# Patient Record
Sex: Male | Born: 1965 | Race: White | Hispanic: No | Marital: Married | Smoking: Never smoker
Health system: Southern US, Community
[De-identification: ages and names within clinical notes are randomized; demographics above are authoritative.]

## PROBLEM LIST (undated history)

## (undated) DIAGNOSIS — E785 Hyperlipidemia, unspecified: Secondary | ICD-10-CM

## (undated) DIAGNOSIS — I10 Essential (primary) hypertension: Secondary | ICD-10-CM

## (undated) HISTORY — DX: Essential (primary) hypertension: I10

## (undated) HISTORY — DX: Hyperlipidemia, unspecified: E78.5

## (undated) HISTORY — PX: HERNIA REPAIR: SHX51

---

## 2005-09-18 HISTORY — PX: SPINAL FUSION: SHX223

## 2006-07-30 ENCOUNTER — Encounter: Admission: RE | Admit: 2006-07-30 | Discharge: 2006-07-30 | Payer: Self-pay | Admitting: Specialist

## 2006-09-06 ENCOUNTER — Inpatient Hospital Stay (HOSPITAL_COMMUNITY): Admission: RE | Admit: 2006-09-06 | Discharge: 2006-09-10 | Payer: Self-pay | Admitting: Specialist

## 2008-02-02 IMAGING — CR DG CHEST 2V
2 series · 2 of 2 positions shown · non-contrast
Comparison: none

CLINICAL DATA: Spondylolisthesis L4-5. Preoperative chest.
CHEST - 2 VIEW:

[view not recorded (1 of 2)]
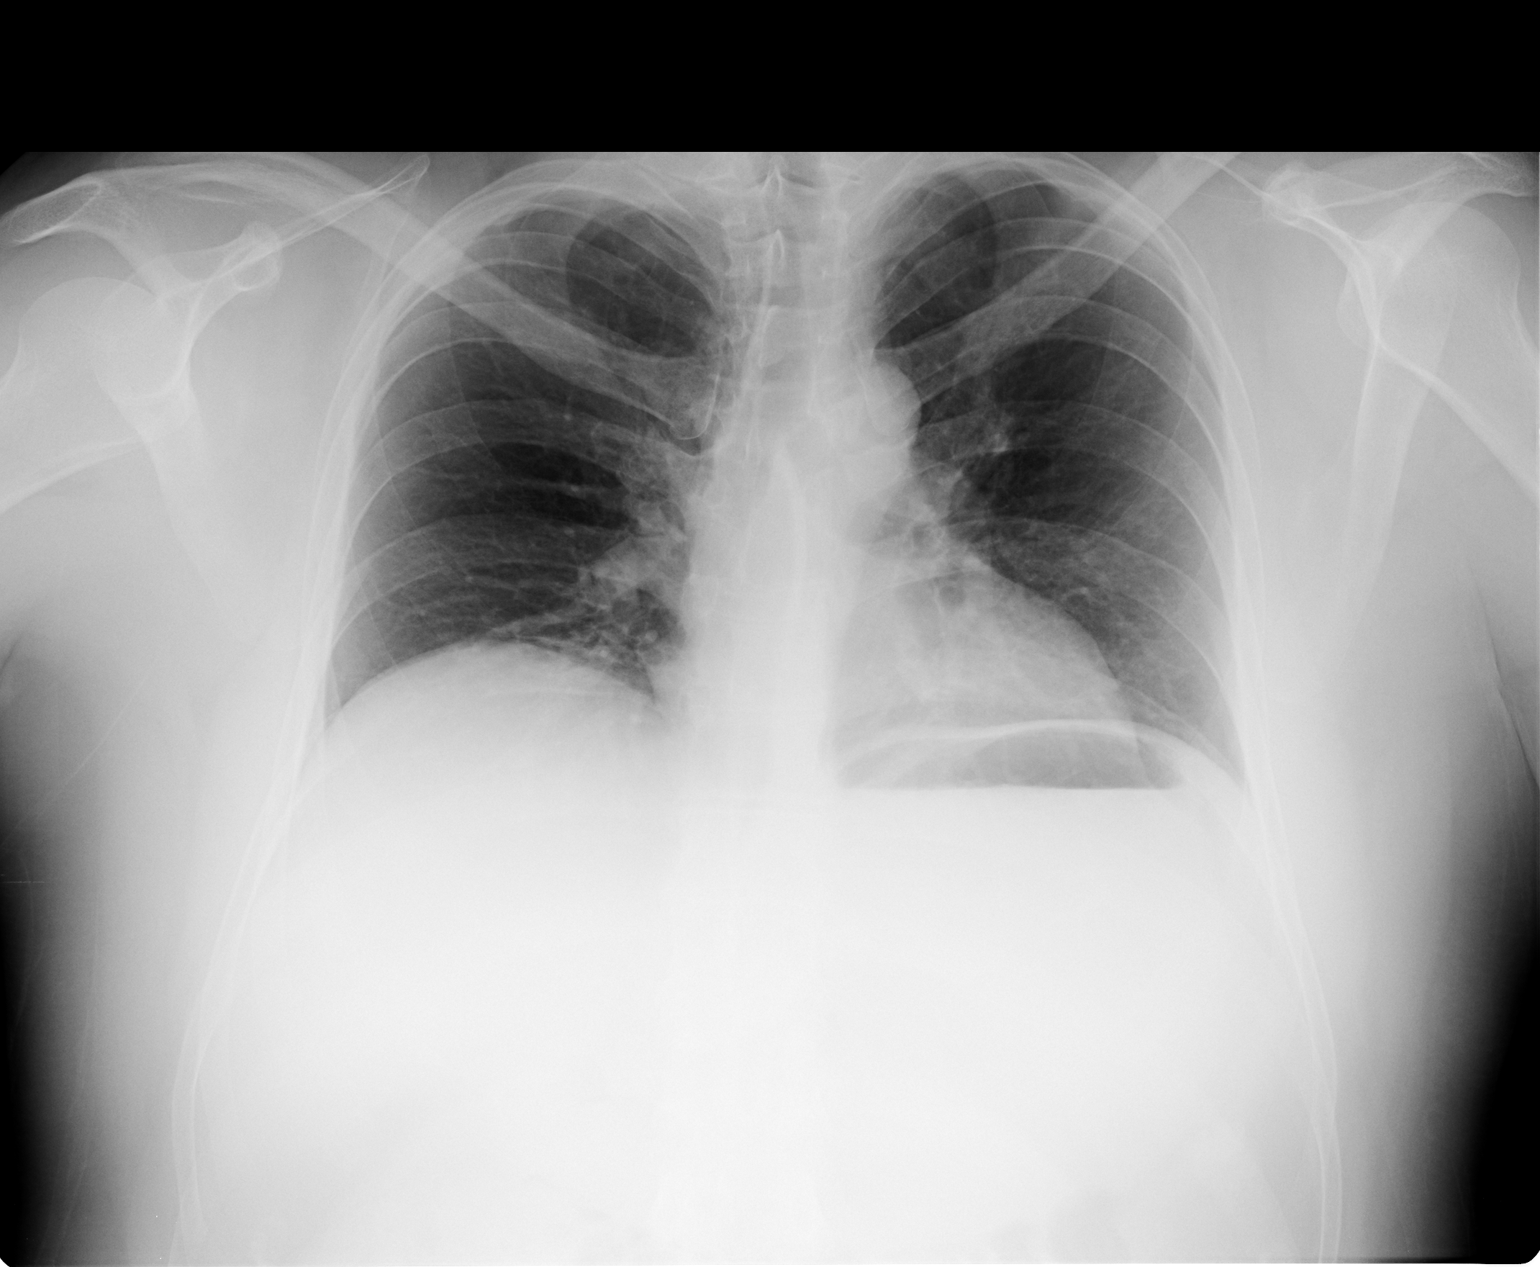

[view not recorded (2 of 2)]
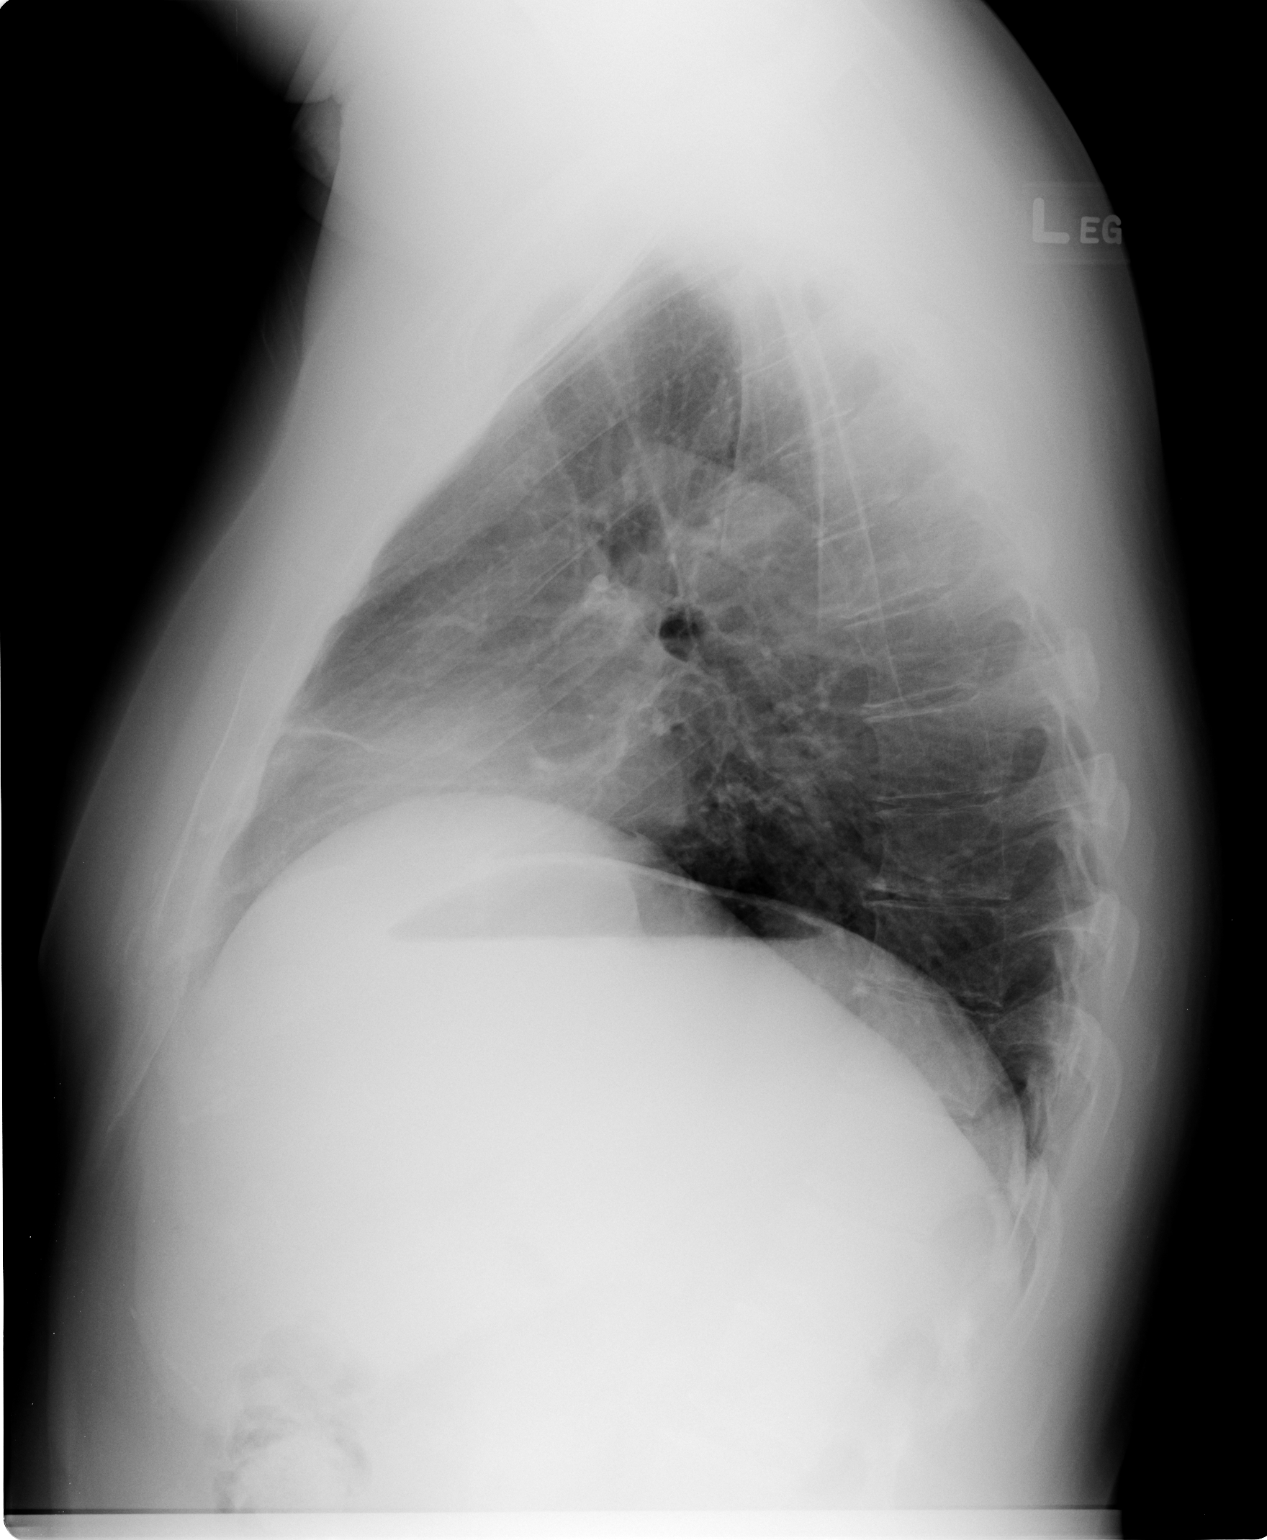

[2 of 2 positions shown; findings below may reference images not displayed]

FINDINGS: There are no prior studies available for comparison at this time. There is mild elevation of the right hemidiaphragm and there is mild accentuation/crowding of the bronchovascular markings within the right lung base.  There is an area of linear scarring/atelectasis seen within the right middle lobe anteriorly located.  The heart is normal in size and there are no mediastinal or hilar abnormalities.
IMPRESSION: Mildly elevated right hemidiaphragm with mild accentuation of the right basilar bronchovascular markings and an area of linear atelectasis/scarring within the right middle lobe.

## 2010-10-27 ENCOUNTER — Ambulatory Visit: Payer: Self-pay

## 2010-10-27 ENCOUNTER — Other Ambulatory Visit: Payer: Self-pay | Admitting: Occupational Medicine

## 2010-10-27 DIAGNOSIS — R52 Pain, unspecified: Secondary | ICD-10-CM

## 2015-07-06 ENCOUNTER — Encounter: Payer: Self-pay | Admitting: Gastroenterology

## 2016-07-12 ENCOUNTER — Ambulatory Visit: Payer: Self-pay | Admitting: Podiatry

## 2016-07-26 ENCOUNTER — Ambulatory Visit: Payer: Self-pay | Admitting: Sports Medicine

## 2016-08-03 ENCOUNTER — Ambulatory Visit: Payer: Self-pay | Admitting: Sports Medicine

## 2016-09-20 ENCOUNTER — Ambulatory Visit: Payer: Self-pay | Admitting: Sports Medicine

## 2020-09-02 NOTE — Progress Notes (Signed)
Cardiology Office Note:    Date:  09/03/2020   ID:  Erik Sutton, DOB 1966/03/20, MRN [id number]  PCP:  Ignacia Palma., MD  Cardiologist:  Norman Herrlich, MD   Referring MD: Dr. Susa Loffler [place] family physician  ASSESSMENT:    1. Paroxysmal atrial fibrillation (HCC)   2. Essential hypertension    PLAN:    In order of problems listed above:  1. Discussion below is a great deal of artifact on his EKG he said he was shivering at the time it is probable atrial fibrillation but not certain what we are going to do here is apply a ZIO monitor for 7 days as a tie breaker monitor closely at home if he had a single isolated brief episode of atrial fibrillation I would not initiate antiarrhythmic drug or an anticoagulant at this time.  To reduce the risk for GI toxicity reduce his aspirin 81 mg daily. 2. BP at target continue current treatment  Next appointment 6 weeks   Medication Adjustments/Labs and Tests Ordered: Current medicines are reviewed at length with the patient today.  Concerns regarding medicines are outlined above.  No orders of the defined types were placed in this encounter.  No orders of the defined types were placed in this encounter.    Chief Complaint  Patient presents with  . Atrial Fibrillation     History of Present Illness:    His wife is present for evaluation and treatment and decision making and participated  Erik Sutton is a 54 y.o. male who is being seen today for the evaluation of atrial fibrillation at the request of his family physician Dr. Susa Loffler who phoned me the day he was seen in the office that the patient was in atrial fibrillation. Today he is in sinus rhythm and has had no symptoms of palpitation chest discomfort shortness of breath or syncope. 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 He has no known  history of heart disease congenital rheumatic or heart murmur.  Attained his records his EKG is difficult he has a great deal of baseline artifact it is in the category of probable atrial fibrillation but there are parts of it that appear regular and the alternative is that he had an episode of atrial tachycardia.  We are going to supply a 7-day ZIO monitor as a tie breaker.  His wife will screen heart rate and blood pressure and record at home looking for signal and I will see him back in the office in 6 weeks  Recent labs 08/25/2020 CMP is normal creatinine 1.03 GFR 81 potassium 4.4 Cholesterol 133 LDL 77 triglycerides 121 HDL 34 he has a very favorable lipid profile TSH normal 2.97 CBC normal hemoglobin 14.7 Past Medical History:  Diagnosis Date  . Hyperlipidemia     Past Surgical History:  Procedure Laterality Date  . HERNIA REPAIR    . SPINAL FUSION  2007   L4, L5    Current Medications: Current Meds  Medication Sig  . ALPRAZolam (XANAX) 1 MG tablet Take by mouth. 0.5 TABLET THREE TIMES A DAY  . amLODipine (NORVASC) 5 MG tablet Take 5 mg by mouth daily.  . APPLE CIDER VINEGAR PO Take 450 mg by mouth daily.  . Ascorbic Acid (VITAMIN C) 1000 MG tablet Take 1,000 mg by mouth daily.  Marland Kitchen aspirin 325 MG tablet Take 325  mg by mouth daily.  . cetirizine (ZYRTEC) 10 MG tablet Take 10 mg by mouth daily.  Marland Kitchen DANDELION PO Take 1,575 mg by mouth daily.  Marland Kitchen ELDERBERRY PO Take 50 mg by mouth daily.  . Flaxseed, Linseed, (FLAXSEED OIL) 1000 MG CAPS Take 1,000 mg by mouth daily.  Marland Kitchen gabapentin (NEURONTIN) 300 MG capsule Take 300 mg by mouth 3 (three) times daily.  Marland Kitchen HYDROcodone-acetaminophen (NORCO/VICODIN) 5-325 MG tablet Take 1 tablet by mouth 2 (two) times daily as needed.  Marland Kitchen lisinopril (ZESTRIL) 40 MG tablet Take 40 mg by mouth daily.  . Multiple Vitamins-Minerals (MULTIVITAMIN MEN PO) Take 1 tablet by mouth.  . Omega-3 Fatty Acids (FISH OIL) 1000 MG CPDR Take 1,000 mg by mouth daily.  Marland Kitchen  omeprazole (PRILOSEC) 20 MG capsule Take 20 mg by mouth daily.  . rosuvastatin (CRESTOR) 10 MG tablet Take 10 mg by mouth at bedtime.  Marland Kitchen UNABLE TO FIND Take 1 tablet by mouth daily. NATURE'S BLEND STRESS  . Vitamin D, Ergocalciferol, (DRISDOL) 1.25 MG (50000 UNIT) CAPS capsule Take 50,000 Units by mouth once a week.  . zolpidem (AMBIEN) 10 MG tablet Take 10 mg by mouth at bedtime as needed.     Allergies:   Latex   Social History   Socioeconomic History  . Marital status: Married    Spouse name: Not on file  . Number of children: Not on file  . Years of education: Not on file  . Highest education level: Not on file  Occupational History  . Not on file  Tobacco Use  . Smoking status: Never Smoker  . Smokeless tobacco: Never Used  Vaping Use  . Vaping Use: Never used  Substance and Sexual Activity  . Alcohol use: Never  . Drug use: Never  . Sexual activity: Not on file  Other Topics Concern  . Not on file  Social History Narrative  . Not on file   Social Determinants of Health   Financial Resource Strain: Not on file  Food Insecurity: Not on file  Transportation Needs: Not on file  Physical Activity: Not on file  Stress: Not on file  Social Connections: Not on file     Family History: The patient's family history includes Atrial fibrillation in his mother; Diabetes in his father; Heart disease in his brother and father; Hypertension in his mother; Parkinson's disease in his paternal grandmother; Stroke in his mother.  ROS:   ROS Please see the history of present illness.   He also has chronic pain and posttraumatic stress.  All other systems reviewed and are negative.  EKGs/Labs/Other Studies Reviewed:    The following studies were reviewed today:   EKG:  EKG is  ordered today.  The ekg ordered today is personally reviewed and demonstrates sinus rhythm and is normal  Recent Labs: No results found for requested labs within last 8760 hours.  Recent Lipid  Panel No results found for: CHOL, TRIG, HDL, CHOLHDL, VLDL, LDLCALC, LDLDIRECT  Physical Exam:    VS:  BP 106/78   Pulse 85   Ht 6\' 2"  (1.88 m)   Wt 249 lb 1.9 oz (113 kg)   SpO2 97%   BMI 31.99 kg/m     Wt Readings from Last 3 Encounters:  09/03/20 249 lb 1.9 oz (113 kg)     GEN:  Well nourished, well developed in no acute distress HEENT: Normal NECK: No JVD; No carotid bruits LYMPHATICS: No lymphadenopathy CARDIAC: RRR, no murmurs, rubs, gallops RESPIRATORY:  Clear to auscultation without rales, wheezing or rhonchi  ABDOMEN: Soft, non-tender, non-distended MUSCULOSKELETAL:  No edema; No deformity  SKIN: Warm and dry NEUROLOGIC:  Alert and oriented x 3 PSYCHIATRIC:  Normal affect     Signed, Norman Herrlich, MD  09/03/2020 10:41 AM    [facility]

## 2020-09-03 ENCOUNTER — Other Ambulatory Visit: Payer: Self-pay

## 2020-09-03 ENCOUNTER — Ambulatory Visit (INDEPENDENT_AMBULATORY_CARE_PROVIDER_SITE_OTHER): Payer: Federal, State, Local not specified - PPO

## 2020-09-03 ENCOUNTER — Ambulatory Visit: Payer: Federal, State, Local not specified - PPO | Admitting: Cardiology

## 2020-09-03 ENCOUNTER — Encounter: Payer: Self-pay | Admitting: Cardiology

## 2020-09-03 VITALS — BP 106/78 | HR 85 | Ht 74.0 in | Wt 249.1 lb

## 2020-09-03 DIAGNOSIS — I480 Paroxysmal atrial fibrillation: Secondary | ICD-10-CM | POA: Diagnosis not present

## 2020-09-03 DIAGNOSIS — I10 Essential (primary) hypertension: Secondary | ICD-10-CM

## 2020-09-03 HISTORY — DX: Essential (primary) hypertension: I10

## 2020-09-03 NOTE — Patient Instructions (Signed)
Medication Instructions:  Your physician recommends that you continue on your current medications as directed. Please refer to the Current Medication list given to you today.  *If you need a refill on your cardiac medications before your next appointment, please call your pharmacy*   Lab Work: None If you have labs (blood work) drawn today and your tests are completely normal, you will receive your results only by: . MyChart Message (if you have MyChart) OR . A paper copy in the mail If you have any lab test that is abnormal or we need to change your treatment, we will call you to review the results.   Testing/Procedures: A zio monitor was ordered today. It will remain on for 7 days. You will then return monitor and event diary in provided box. It takes 1-2 weeks for report to be downloaded and returned to us. We will call you with the results. If monitor falls off or has orange flashing light, please call Z[employer] for further instructions.      Follow-Up: At [facility], you and your health needs are our priority.  As part of our continuing mission to provide you with exceptional heart care, we have created designated Provider Care Teams.  These Care Teams include your primary Cardiologist (physician) and Advanced Practice Providers (APPs -  Physician Assistants and Nurse Practitioners) who all work together to provide you with the care you need, when you need it.  We recommend signing up for the patient portal called "MyChart".  Sign up information is provided on this After Visit Summary.  MyChart is used to connect with patients for Virtual Visits (Telemedicine).  Patients are able to view lab/test results, encounter notes, upcoming appointments, etc.  Non-urgent messages can be sent to your provider as well.   To learn more about what you can do with MyChart, go to [email or web address].    Your next appointment:   6 week(s)  The format for your next appointment:   In  Person  Provider:   Brian Munley, MD   Other Instructions  

## 2020-09-09 ENCOUNTER — Telehealth: Payer: Self-pay | Admitting: Cardiology

## 2020-09-09 NOTE — Telephone Encounter (Signed)
    Pt's wife calling, she said pt is asking how long he needs to wear heart monitor

## 2020-09-09 NOTE — Telephone Encounter (Signed)
I called and spoke with Vickie and explained the monitor should be on for 7 days as specified on notes. Patient also comment she call Zio and they mention registration was not completed and they stated the onset date yesterday. I called [employer] and had them correct the date to 12/17 when the monitor was put on. Patient wife has been notified.

## 2020-09-26 ENCOUNTER — Encounter: Payer: Self-pay | Admitting: Cardiology

## 2020-09-27 ENCOUNTER — Telehealth: Payer: Self-pay

## 2020-09-27 NOTE — Telephone Encounter (Signed)
-----   Message from Baldo Daub, MD sent at 09/26/2020 11:06 AM EST ----- Please call this report to the patient, the monitor indeed shows he is having episodes of atrial fibrillation about 20% of the time he wore the monitor  I would like him to start a low-dose beta-blocker that she reduce the frequency and duration Toprol-XL 25 mg daily  Continue aspirin and I can review with him at office follow-up scheduled February  A ZIO monitor was used for 7 days initiated 09/03/2020 to assess for atrial fibrillation. The predominant rhythm was sinus with average minimum maximum heart rates of 74, 51 and 133 bpm. Atrial fibrillation was present 20% of the time average heart rate 89 bpm the longest episode 4 hours and 40 minutes.  Ventricular ectopy was rare.  Supraventricular ectopy was rare.  There were no pauses of 3 seconds or greater and no episodes of AV nodal or sinus node exit block.  There was brief atrial flutter prior to termination of an episode of atrial fibrillation.  The longest pause on conversion to sinus rhythm was 1.7 seconds.   Conclusion atrial fibrillation/flutter with a burden of 20%.

## 2020-09-27 NOTE — Telephone Encounter (Signed)
Left message on patients voicemail to please return our call.   

## 2020-09-28 ENCOUNTER — Telehealth: Payer: Self-pay

## 2020-09-28 MED ORDER — METOPROLOL SUCCINATE ER 25 MG PO TB24: 25.0000 mg | ORAL_TABLET | Freq: Every day | ORAL | 3 refills | Status: DC

## 2020-09-28 NOTE — Telephone Encounter (Signed)
Spoke with patient regarding results and recommendation.  Patient verbalizes understanding and is agreeable to plan of care. Advised patient to call back with any issues or concerns.  

## 2020-09-28 NOTE — Telephone Encounter (Signed)
-----   Message from Brian J Munley, MD sent at 09/26/2020 11:06 AM EST ----- Please call this report to the patient, the monitor indeed shows he is having episodes of atrial fibrillation about 20% of the time he wore the monitor  I would like him to start a low-dose beta-blocker that she reduce the frequency and duration Toprol-XL 25 mg daily  Continue aspirin and I can review with him at office follow-up scheduled February  A ZIO monitor was used for 7 days initiated 09/03/2020 to assess for atrial fibrillation. The predominant rhythm was sinus with average minimum maximum heart rates of 74, 51 and 133 bpm. Atrial fibrillation was present 20% of the time average heart rate 89 bpm the longest episode 4 hours and 40 minutes.  Ventricular ectopy was rare.  Supraventricular ectopy was rare.  There were no pauses of 3 seconds or greater and no episodes of AV nodal or sinus node exit block.  There was brief atrial flutter prior to termination of an episode of atrial fibrillation.  The longest pause on conversion to sinus rhythm was 1.7 seconds.   Conclusion atrial fibrillation/flutter with a burden of 20%. 

## 2020-10-15 DIAGNOSIS — E785 Hyperlipidemia, unspecified: Secondary | ICD-10-CM | POA: Insufficient documentation

## 2020-10-15 DIAGNOSIS — I10 Essential (primary) hypertension: Secondary | ICD-10-CM | POA: Insufficient documentation

## 2020-10-27 NOTE — Progress Notes (Signed)
Cardiology Office Note:    Date:  10/28/2020   ID:  Erik Sutton, DOB [birth year], MRN [id number]  PCP:  Erik Sutton., MD  Cardiologist:  Erik Herrlich, MD    Referring MD: Erik Sutton., MD    ASSESSMENT:    1. Paroxysmal atrial fibrillation (HCC)   2. Essential hypertension   3. Mixed hyperlipidemia    PLAN:    In order of problems listed above:  1. Symptomatically doing well tolerates a beta-blocker no obvious clinical recurrence for better surveillance asked him to purchase the iPhone adapter sign up for my chart and send me episodes if he documents A. fib.  If episodes are frequent he may benefit him antiarrhythmic drug like flecainide and also rethink the issue of anticoagulation.  I did ask him to have an echocardiogram to assess structural changes in his heart. 2. Well-controlled continue lisinopril await labs for renal function potassium 3. Continue statin await his lipid profile   Next appointment: 6 months   Medication Adjustments/Labs and Tests Ordered: Current medicines are reviewed at length with the patient today.  Concerns regarding medicines are outlined above.  No orders of the defined types were placed in this encounter.  No orders of the defined types were placed in this encounter.   Chief Complaint  Patient presents with   Follow-up   Atrial Fibrillation    History of Present Illness:    Erik Sutton is a 55 y.o. male with a hx of paroxysmal atrial fibrillation last seen 02/02/2020.  Following that visit he used an event monitor which documented atrial fibrillation with a burden of 20% was placed on a low-dose beta-blocker. Compliance with diet, lifestyle and medications: Yes  He has no awareness of recurrent atrial fibrillation tracks heart rate at home 6070 bpm no irregularity and blood pressure runs 120/805.  He is on low-dose aspirin no GI upset or bleeding and tolerates the beta-blocker without side effects or weakness. We discussed  anticoagulation with a chads 2 score of 1 and he is decided to remain on aspirin. We discussed antiarrhythmic drug therapy at this time he is asymptomatic and what I have asked him to do is purchased the alive cor smart phone peripheral and screen his heart rhythm at home and if he is having frequent episodes and send me a my chart and we will start antiarrhythmic drug or consider EP catheter ablation. He had recent labs at his PCP office I requested him he tells me they are all in range Blood pressure is well controlled And will schedule an echocardiogram for atrial fibrillation.  Past Medical History:  Diagnosis Date   Essential hypertension 09/03/2020   Hyperlipidemia    Hypertension     Past Surgical History:  Procedure Laterality Date   HERNIA REPAIR     SPINAL FUSION  2007   L4, L5    Current Medications: Current Meds  Medication Sig   ALPRAZolam (XANAX) 1 MG tablet Take by mouth. 0.5 TABLET THREE TIMES A DAY   amLODipine (NORVASC) 5 MG tablet Take 5 mg by mouth daily.   APPLE CIDER VINEGAR PO Take 450 mg by mouth daily.   Ascorbic Acid (VITAMIN C) 1000 MG tablet Take 1,000 mg by mouth daily.   aspirin EC 81 MG tablet Take 81 mg by mouth daily. Swallow whole.   cetirizine (ZYRTEC) 10 MG tablet Take 10 mg by mouth daily.   cholecalciferol (VITAMIN D3) 25 MCG (1000 UNIT) tablet Take 1,000 Units by mouth daily.  DANDELION PO Take 1,575 mg by mouth daily.   ELDERBERRY PO Take 50 mg by mouth daily.   Flaxseed, Linseed, (FLAXSEED OIL) 1000 MG CAPS Take 1,000 mg by mouth daily.   gabapentin (NEURONTIN) 300 MG capsule Take 300 mg by mouth 3 (three) times daily.   HYDROcodone-acetaminophen (NORCO/VICODIN) 5-325 MG tablet Take 1 tablet by mouth 2 (two) times daily as needed.   lisinopril (ZESTRIL) 40 MG tablet Take 40 mg by mouth daily.   metoprolol succinate (TOPROL XL) 25 MG 24 hr tablet Take 1 tablet (25 mg total) by mouth daily.   Multiple Vitamins-Minerals  (MULTIVITAMIN MEN PO) Take 1 tablet by mouth.   Omega-3 Fatty Acids (FISH OIL) 1000 MG CPDR Take 1,000 mg by mouth daily.   omeprazole (PRILOSEC) 20 MG capsule Take 20 mg by mouth daily.   rosuvastatin (CRESTOR) 10 MG tablet Take 10 mg by mouth at bedtime.   UNABLE TO FIND Take 1 tablet by mouth daily. NATURE'S BLEND STRESS   zolpidem (AMBIEN) 10 MG tablet Take 10 mg by mouth at bedtime as needed for sleep.     Allergies:   Latex   Social History   Socioeconomic History   Marital status: Married    Spouse name: Not on file   Number of children: Not on file   Years of education: Not on file   Highest education level: Not on file  Occupational History   Not on file  Tobacco Use   Smoking status: Never Smoker   Smokeless tobacco: Never Used  Vaping Use   Vaping Use: Never used  Substance and Sexual Activity   Alcohol use: Never   Drug use: Never   Sexual activity: Not on file  Other Topics Concern   Not on file  Social History Narrative   Not on file   Social Determinants of Health   Financial Resource Strain: Not on file  Food Insecurity: Not on file  Transportation Needs: Not on file  Physical Activity: Not on file  Stress: Not on file  Social Connections: Not on file     Family History: The patient's family history includes Atrial fibrillation in his mother; Diabetes in his father; Heart disease in his brother and father; Hypertension in his mother; Parkinson's disease in his paternal grandmother; Stroke in his mother. ROS:   Please see the history of present illness.    All other systems reviewed and are negative.  EKGs/Labs/Other Studies Reviewed:    The following studies were reviewed today:  EKG:  EKG ordered today and personally reviewed.  The ekg ordered today demonstrates sinus rhythm normal  Recent Labs: Requesting and awaiting labs from his PCP open addendum to this note  Physical Exam:    VS:  BP 118/84 (BP Location: Right Arm,  Patient Position: Sitting, Cuff Size: Normal)    Pulse 64    Ht 6\' 2"  (1.88 m)    Wt 252 lb (114.3 kg)    SpO2 96%    BMI 32.35 kg/m     Wt Readings from Last 3 Encounters:  10/28/20 252 lb (114.3 kg)  09/03/20 249 lb 1.9 oz (113 kg)     GEN:  Well nourished, well developed in no acute distress HEENT: Normal NECK: No JVD; No carotid bruits LYMPHATICS: No lymphadenopathy CARDIAC: RRR, no murmurs, rubs, gallops RESPIRATORY:  Clear to auscultation without rales, wheezing or rhonchi  ABDOMEN: Soft, non-tender, non-distended MUSCULOSKELETAL:  No edema; No deformity  SKIN: Warm and dry NEUROLOGIC:  Alert  and oriented x 3 PSYCHIATRIC:  Normal affect    Signed, Erik Herrlich, MD  10/28/2020 2:44 PM    [facility]

## 2020-10-28 ENCOUNTER — Other Ambulatory Visit: Payer: Self-pay

## 2020-10-28 ENCOUNTER — Encounter: Payer: Self-pay | Admitting: Cardiology

## 2020-10-28 ENCOUNTER — Ambulatory Visit: Payer: Federal, State, Local not specified - PPO | Admitting: Cardiology

## 2020-10-28 VITALS — BP 118/84 | HR 64 | Ht 74.0 in | Wt 252.0 lb

## 2020-10-28 DIAGNOSIS — I480 Paroxysmal atrial fibrillation: Secondary | ICD-10-CM

## 2020-10-28 DIAGNOSIS — E782 Mixed hyperlipidemia: Secondary | ICD-10-CM

## 2020-10-28 DIAGNOSIS — I10 Essential (primary) hypertension: Secondary | ICD-10-CM | POA: Diagnosis not present

## 2020-10-28 NOTE — Patient Instructions (Addendum)
Medication Instructions:  Your physician recommends that you continue on your current medications as directed. Please refer to the Current Medication list given to you today.  *If you need a refill on your cardiac medications before your next appointment, please call your pharmacy*   Lab Work: None If you have labs (blood work) drawn today and your tests are completely normal, you will receive your results only by: . MyChart Message (if you have MyChart) OR . A paper copy in the mail If you have any lab test that is abnormal or we need to change your treatment, we will call you to review the results.   Testing/Procedures: Your physician has requested that you have an echocardiogram. Echocardiography is a painless test that uses sound waves to create images of your heart. It provides your doctor with information about the size and shape of your heart and how well your heart's chambers and valves are working. This procedure takes approximately one hour. There are no restrictions for this procedure.     Follow-Up: At [facility], you and your health needs are our priority.  As part of our continuing mission to provide you with exceptional heart care, we have created designated Provider Care Teams.  These Care Teams include your primary Cardiologist (physician) and Advanced Practice Providers (APPs -  Physician Assistants and Nurse Practitioners) who all work together to provide you with the care you need, when you need it.  We recommend signing up for the patient portal called "MyChart".  Sign up information is provided on this After Visit Summary.  MyChart is used to connect with patients for Virtual Visits (Telemedicine).  Patients are able to view lab/test results, encounter notes, upcoming appointments, etc.  Non-urgent messages can be sent to your provider as well.   To learn more about what you can do with MyChart, go to [email or web address].    Your next appointment:   6  month(s)  The format for your next appointment:   In Person  Provider:   Marie Borowski, MD   Other Instructions   KardiaMobile [email or web address]        FDA-cleared, clinical grade mobile EKG monitor: Kardia is the most clinically-validated mobile EKG used by the world's leading cardiac care medical professionals With Basic service, know instantly if your heart rhythm is normal or if atrial fibrillation is detected, and email the last single EKG recording to yourself or your doctor Premium service, available for purchase through the Kardia app for $9.99 per month or $99 per year, includes unlimited history and storage of your EKG recordings, a monthly EKG summary report to share with your doctor, along with the ability to track your blood pressure, activity and weight Includes one KardiaMobile phone clip FREE SHIPPING: Standard delivery 1-3 business days. Orders placed by 11:00am PST will ship that afternoon. Otherwise, will ship next business day. All orders ship via USPS Priority Mail from [place], [place]     

## 2020-11-29 ENCOUNTER — Other Ambulatory Visit: Payer: Self-pay

## 2020-11-29 ENCOUNTER — Ambulatory Visit (INDEPENDENT_AMBULATORY_CARE_PROVIDER_SITE_OTHER): Payer: Federal, State, Local not specified - PPO

## 2020-11-29 ENCOUNTER — Telehealth: Payer: Self-pay

## 2020-11-29 DIAGNOSIS — I480 Paroxysmal atrial fibrillation: Secondary | ICD-10-CM

## 2020-11-29 LAB — ECHOCARDIOGRAM COMPLETE
Area-P 1/2: 4.52 cm2
Calc EF: 57 %
S' Lateral: 2.85 cm
Single Plane A2C EF: 60.8 %
Single Plane A4C EF: 52.7 %

## 2020-11-29 NOTE — Telephone Encounter (Signed)
Patient notified of results.

## 2020-11-29 NOTE — Telephone Encounter (Signed)
-----   Message from Baldo Daub, MD sent at 11/29/2020 12:32 PM EDT ----- Good result echocardiogram is normal

## 2020-11-29 NOTE — Progress Notes (Signed)
Complete echocardiogram performed.  Jimmy Seham [place] RDCS, RVT  

## 2020-12-07 NOTE — Telephone Encounter (Signed)
Patient's wife is returning phone call. Please advise. [phone].

## 2020-12-08 ENCOUNTER — Ambulatory Visit: Payer: Federal, State, Local not specified - PPO | Admitting: Cardiology

## 2020-12-08 NOTE — Progress Notes (Deleted)
Cardiology Office Note:    Date:  12/08/2020   ID:  Erik Sutton, DOB [birth year], MRN [id number]  PCP:  Ignacia Palma., MD  Cardiologist:  Norman Herrlich, MD    Referring MD: Ignacia Palma., MD    ASSESSMENT:    No diagnosis found. PLAN:    In order of problems listed above:  1. ***   Next appointment: ***   Medication Adjustments/Labs and Tests Ordered: Current medicines are reviewed at length with the patient today.  Concerns regarding medicines are outlined above.  No orders of the defined types were placed in this encounter.  No orders of the defined types were placed in this encounter.   No chief complaint on file.   History of Present Illness:    Erik Sutton is a 55 y.o. male with a hx of paroxysmal atrial fibrillation with an A. fib burden of 20% on extended ambulatory monitoring hypertension hyper lipidemia last seen 10/28/2020.  He phoned the office yesterday to tell us he is having increasing frequency and duration of symptomatic atrial fibrillation and was advised office follow-up as he is going to require antiarrhythmic drug therapy or referral for EP catheter ablation. Compliance with diet, lifestyle and medications: *** Past Medical History:  Diagnosis Date  . Essential hypertension 09/03/2020  . Hyperlipidemia   . Hypertension     Past Surgical History:  Procedure Laterality Date  . HERNIA REPAIR    . SPINAL FUSION  2007   L4, L5    Current Medications: No outpatient medications have been marked as taking for the 12/08/20 encounter (Appointment) with Baldo Daub, MD.     Allergies:   Latex   Social History   Socioeconomic History  . Marital status: Married    Spouse name: Not on file  . Number of children: Not on file  . Years of education: Not on file  . Highest education level: Not on file  Occupational History  . Not on file  Tobacco Use  . Smoking status: Never Smoker  . Smokeless tobacco: Never Used  Vaping Use  . Vaping  Use: Never used  Substance and Sexual Activity  . Alcohol use: Never  . Drug use: Never  . Sexual activity: Not on file  Other Topics Concern  . Not on file  Social History Narrative  . Not on file   Social Determinants of Health   Financial Resource Strain: Not on file  Food Insecurity: Not on file  Transportation Needs: Not on file  Physical Activity: Not on file  Stress: Not on file  Social Connections: Not on file     Family History: The patient's ***family history includes Atrial fibrillation in his mother; Diabetes in his father; Heart disease in his brother and father; Hypertension in his mother; Parkinson's disease in his paternal grandmother; Stroke in his mother. ROS:   Please see the history of present illness.    All other systems reviewed and are negative.  EKGs/Labs/Other Studies Reviewed:    The following studies were reviewed today:  EKG:  EKG ordered today and personally reviewed.  The ekg ordered today demonstrates ***  Recent Labs: No results found for requested labs within last 8760 hours.  Recent Lipid Panel No results found for: CHOL, TRIG, HDL, CHOLHDL, VLDL, LDLCALC, LDLDIRECT  Physical Exam:    VS:  There were no vitals taken for this visit.    Wt Readings from Last 3 Encounters:  10/28/20 252 lb (114.3 kg)  09/03/20 249  lb 1.9 oz (113 kg)     GEN: *** Well nourished, well developed in no acute distress HEENT: Normal NECK: No JVD; No carotid bruits LYMPHATICS: No lymphadenopathy CARDIAC: ***RRR, no murmurs, rubs, gallops RESPIRATORY:  Clear to auscultation without rales, wheezing or rhonchi  ABDOMEN: Soft, non-tender, non-distended MUSCULOSKELETAL:  No edema; No deformity  SKIN: Warm and dry NEUROLOGIC:  Alert and oriented x 3 PSYCHIATRIC:  Normal affect    Signed, Norman Herrlich, MD  12/08/2020 8:22 AM    [facility]

## 2020-12-16 ENCOUNTER — Telehealth: Payer: Self-pay | Admitting: Cardiology

## 2020-12-16 NOTE — Telephone Encounter (Signed)
FYI--Patient's wife called to cancel his appointment on 12/17/20. He will still follow up with Dr. Dulce Sellar on 04/27/21. She wanted to make Dr. Dulce Sellar aware that the patient's issue with afib has been resolved. She states he was taking his beta-blocker with other supplements. They assume the supplements were causing the Metoprolol to be ineffective.  She states the patient stated taking his his Metoprolol at a different time of day and he has not gone into afib at all ever since.

## 2020-12-17 ENCOUNTER — Ambulatory Visit: Payer: Federal, State, Local not specified - PPO | Admitting: Cardiology

## 2020-12-21 ENCOUNTER — Telehealth: Payer: Self-pay | Admitting: Cardiology

## 2020-12-21 NOTE — Telephone Encounter (Signed)
Scheduler [place] about this.

## 2020-12-21 NOTE — Telephone Encounter (Signed)
Called and spoke to patient wife per dpr. She reports the patient has been under a lot of stress at work lately. On Saturday he ate something with chocolate in it (which he has not been doing, he is also limiting caffeine) and he went back into atrial fibrillation that night and was feeling palpitations in his chest. He did not have any chest pain or shortness of breath during this episode. He is not having any issues now either he feels better now. But his wife is requesting he be checked out. He wants to be seen next week. He can be seen any day besides April 14th, 2021. I advised her we will check with Dr. Dulce Sellar to see if he wants to add him on next week.

## 2020-12-21 NOTE — Telephone Encounter (Signed)
Unfortunately have no ability to fit in more patients in my schedule before you leave for my son's wedding unless there is an opening at the med [facility] schedule please contact Amalia Hailey otherwise will need to see another physician.

## 2020-12-21 NOTE — Telephone Encounter (Signed)
Patient c/o Palpitations:  High priority if patient c/o lightheadedness, shortness of breath, or chest pain  1) How long have you had palpitations/irregular HR/ Afib? Are you having the symptoms now? 4-5 days, not having symptoms now   2) Are you currently experiencing lightheadedness, SOB or CP? No   3) Do you have a history of afib (atrial fibrillation) or irregular heart rhythm? Yes, was diagnosised about 6 months ago   4) Have you checked your BP or HR? (document readings if available):   96/50's the other night (has BP readings recorded on app, not with husband now  to get readings)  5) Are you experiencing any other symptoms? No

## 2020-12-30 ENCOUNTER — Ambulatory Visit: Payer: Federal, State, Local not specified - PPO | Admitting: Cardiology

## 2020-12-30 NOTE — Progress Notes (Signed)
Cardiology Office Note:    Date:  12/31/2020   ID:  Erik Sutton, DOB 1965-09-20, MRN [id number]  PCP:  Ignacia Palma., MD  Cardiologist:  Norman Herrlich, MD    Referring MD: Ignacia Palma., MD    ASSESSMENT:    1. Paroxysmal atrial fibrillation (HCC)   2. Essential hypertension    PLAN:    In order of problems listed above:  1. We will initiate antiarrhythmic drug therapy low-dose flecainide screen his heart rhythm daily continue his beta-blocker and he declines anticoagulant treatment will take low-dose aspirin 2. Stable continue treatment beta-blocker ACE inhibitor   Next appointment: 3 months follow-up in clinic 1 week for nurse EKG visit.   Medication Adjustments/Labs and Tests Ordered: Current medicines are reviewed at length with the patient today.  Concerns regarding medicines are outlined above.  No orders of the defined types were placed in this encounter.  Meds ordered this encounter  Medications  . flecainide (TAMBOCOR) 50 MG tablet    Sig: Take 1 tablet (50 mg total) by mouth 2 (two) times daily.    Dispense:  180 tablet    Refill:  3  . Blood Pressure Monitoring (BLOOD PRESSURE CUFF) MISC    Sig: 1 each by Does not apply route once for 1 dose.    Dispense:  1 each    Refill:  0    No chief complaint on file.   History of Present Illness:    Erik Sutton is a 55 y.o. male with a hx of paroxysmal atrial fibrillation hypertension and hyper lipidemia last seen 10/28/2020.  He is being seen today at his request having a higher frequency of atrial fibrillation.  Compliance with diet, lifestyle and medications: Yes  He brings his iPhone record and has had multiple episodes of atrial fibrillation only 2 since he communicated to me 1 of which lasted 8 hours. We reviewed the options for his atrial fibrillation including antiarrhythmic therapy with instruction normal heart versus referral for EP catheter ablation he prefers antiarrhythmic drug treatment. I  advised anticoagulation and he declines he said his mother died as a complication of intracranial hemorrhage with stroke while taking an anticoagulant.  Following his last visit he had an echocardiogram 11/29/2020 EF 55 to 60% normal diastolic filling pressure normal right ventricle.  The atria are normal in size and there is no valvular abnormality. Past Medical History:  Diagnosis Date  . Essential hypertension 09/03/2020  . Hyperlipidemia   . Hypertension     Past Surgical History:  Procedure Laterality Date  . HERNIA REPAIR    . SPINAL FUSION  2007   L4, L5    Current Medications: Current Meds  Medication Sig  . ALPRAZolam (XANAX) 1 MG tablet Take by mouth. 0.5 TABLET THREE TIMES A DAY  . amLODipine (NORVASC) 5 MG tablet Take 5 mg by mouth daily.  . APPLE CIDER VINEGAR PO Take 450 mg by mouth daily.  . Ascorbic Acid (VITAMIN C) 1000 MG tablet Take 1,000 mg by mouth daily.  Marland Kitchen aspirin EC 81 MG tablet Take 81 mg by mouth daily. Swallow whole.  . Blood Pressure Monitoring (BLOOD PRESSURE CUFF) MISC 1 each by Does not apply route once for 1 dose.  . cetirizine (ZYRTEC) 10 MG tablet Take 10 mg by mouth daily.  . cholecalciferol (VITAMIN D3) 25 MCG (1000 UNIT) tablet Take 1,000 Units by mouth daily.  Marland Kitchen DANDELION PO Take 1,575 mg by mouth daily.  Marland Kitchen ELDERBERRY PO Take 50  mg by mouth daily.  . Flaxseed, Linseed, (FLAXSEED OIL) 1000 MG CAPS Take 1,000 mg by mouth daily.  . flecainide (TAMBOCOR) 50 MG tablet Take 1 tablet (50 mg total) by mouth 2 (two) times daily.  Marland Kitchen gabapentin (NEURONTIN) 300 MG capsule Take 300 mg by mouth 3 (three) times daily.  Marland Kitchen HYDROcodone-acetaminophen (NORCO/VICODIN) 5-325 MG tablet Take 1 tablet by mouth 2 (two) times daily as needed.  Marland Kitchen lisinopril (ZESTRIL) 40 MG tablet Take 40 mg by mouth daily.  . metoprolol succinate (TOPROL XL) 25 MG 24 hr tablet Take 1 tablet (25 mg total) by mouth daily.  . Multiple Vitamins-Minerals (MULTIVITAMIN MEN PO) Take 1 tablet by  mouth.  . Omega-3 Fatty Acids (FISH OIL) 1000 MG CPDR Take 1,000 mg by mouth daily.  Marland Kitchen omeprazole (PRILOSEC) 20 MG capsule Take 20 mg by mouth daily.  . rosuvastatin (CRESTOR) 10 MG tablet Take 10 mg by mouth at bedtime.  Marland Kitchen UNABLE TO FIND Take 1 tablet by mouth daily. NATURE'S BLEND STRESS  . zolpidem (AMBIEN) 10 MG tablet Take 10 mg by mouth at bedtime as needed for sleep.     Allergies:   Latex   Social History   Socioeconomic History  . Marital status: Married    Spouse name: Not on file  . Number of children: Not on file  . Years of education: Not on file  . Highest education level: Not on file  Occupational History  . Not on file  Tobacco Use  . Smoking status: Never Smoker  . Smokeless tobacco: Never Used  Vaping Use  . Vaping Use: Never used  Substance and Sexual Activity  . Alcohol use: Never  . Drug use: Never  . Sexual activity: Not on file  Other Topics Concern  . Not on file  Social History Narrative  . Not on file   Social Determinants of Health   Financial Resource Strain: Not on file  Food Insecurity: Not on file  Transportation Needs: Not on file  Physical Activity: Not on file  Stress: Not on file  Social Connections: Not on file     Family History: The patient's family history includes Atrial fibrillation in his mother; Diabetes in his father; Heart disease in his brother and father; Hypertension in his mother; Parkinson's disease in his paternal grandmother; Stroke in his mother. ROS:   Please see the history of present illness.    All other systems reviewed and are negative.  EKGs/Labs/Other Studies Reviewed:    The following studies were reviewed today:  EKG: I reviewed in excess of 10 strips on his phone device   Physical Exam:    VS:  BP 118/62 (BP Location: Right Arm, Patient Position: Sitting, Cuff Size: Normal)   Pulse 60   Ht 6\' 2"  (1.88 m)   Wt 243 lb 1.9 oz (110.3 kg)   SpO2 99%   BMI 31.21 kg/m     Wt Readings from Last  3 Encounters:  12/31/20 243 lb 1.9 oz (110.3 kg)  10/28/20 252 lb (114.3 kg)  09/03/20 249 lb 1.9 oz (113 kg)     GEN:  Well nourished, well developed in no acute distress HEENT: Normal NECK: No JVD; No carotid bruits LYMPHATICS: No lymphadenopathy CARDIAC: RRR, no murmurs, rubs, gallops RESPIRATORY:  Clear to auscultation without rales, wheezing or rhonchi  ABDOMEN: Soft, non-tender, non-distended MUSCULOSKELETAL:  No edema; No deformity  SKIN: Warm and dry NEUROLOGIC:  Alert and oriented x 3 PSYCHIATRIC:  Normal affect  Signed, Norman Herrlich, MD  12/31/2020 11:10 AM    [facility]

## 2020-12-31 ENCOUNTER — Other Ambulatory Visit: Payer: Self-pay

## 2020-12-31 ENCOUNTER — Ambulatory Visit: Payer: Federal, State, Local not specified - PPO | Admitting: Cardiology

## 2020-12-31 ENCOUNTER — Encounter: Payer: Self-pay | Admitting: Cardiology

## 2020-12-31 VITALS — BP 118/62 | HR 60 | Ht 74.0 in | Wt 243.1 lb

## 2020-12-31 DIAGNOSIS — I10 Essential (primary) hypertension: Secondary | ICD-10-CM

## 2020-12-31 DIAGNOSIS — I480 Paroxysmal atrial fibrillation: Secondary | ICD-10-CM

## 2020-12-31 MED ORDER — FLECAINIDE ACETATE 50 MG PO TABS: 50.0000 mg | ORAL_TABLET | Freq: Two times a day (BID) | ORAL | 3 refills | Status: DC

## 2020-12-31 MED ORDER — BLOOD PRESSURE CUFF MISC: 1.0000 | Freq: Once | 0 refills | Status: AC

## 2020-12-31 NOTE — Patient Instructions (Signed)
Medication Instructions:  Your physician has recommended you make the following change in your medication:  START: Flecainide 50 mg take one tablet by mouth twice daily.  *If you need a refill on your cardiac medications before your next appointment, please call your pharmacy*   Lab Work: None If you have labs (blood work) drawn today and your tests are completely normal, you will receive your results only by: Marland Kitchen MyChart Message (if you have MyChart) OR . A paper copy in the mail If you have any lab test that is abnormal or we need to change your treatment, we will call you to review the results.   Testing/Procedures: NOne   Follow-Up: At [facility], you and your health needs are our priority.  As part of our continuing mission to provide you with exceptional heart care, we have created designated Provider Care Teams.  These Care Teams include your primary Cardiologist (physician) and Advanced Practice Providers (APPs -  Physician Assistants and Nurse Practitioners) who all work together to provide you with the care you need, when you need it.  We recommend signing up for the patient portal called "MyChart".  Sign up information is provided on this After Visit Summary.  MyChart is used to connect with patients for Virtual Visits (Telemedicine).  Patients are able to view lab/test results, encounter notes, upcoming appointments, etc.  Non-urgent messages can be sent to your provider as well.   To learn more about what you can do with MyChart, go to [email or web address].    Your next appointment:   3 month(s)  The format for your next appointment:   In Person  Provider:   Norman Herrlich, MD   Other Instructions

## 2021-01-03 NOTE — Addendum Note (Signed)
Addended by: Delorse Limber I on: 01/03/2021 02:37 PM   Modules accepted: Orders

## 2021-01-03 NOTE — Addendum Note (Signed)
Addended by: Roosvelt Harps R on: 01/03/2021 02:49 PM   Modules accepted: Orders

## 2021-02-09 ENCOUNTER — Telehealth: Payer: Self-pay | Admitting: Cardiology

## 2021-02-09 NOTE — Telephone Encounter (Signed)
Spoke to the patients wife just now and she let me know that the patient got some different readings on his [employer]. He got a readings that stated sinus rhythm with PVC's. I requested that she send [place] over the strip at her earliest convenience. She states that they will dot his tonight when the patient gets home from work.    Encouraged patient to call back with any questions or concerns.

## 2021-02-09 NOTE — Telephone Encounter (Signed)
PT's wife is calling in with concerns about the app the doctor told her to put on her husbands device ity is not working.She also states the readings are abnormal

## 2021-02-22 ENCOUNTER — Telehealth: Payer: Self-pay | Admitting: Cardiology

## 2021-02-22 NOTE — Telephone Encounter (Signed)
Patient's wife was calling in regarding the patient's afib machine. Wife stated its error on the machine. Not sure what to do . Please advise

## 2021-02-22 NOTE — Telephone Encounter (Signed)
Spoke to patient. He reports his kardia device is reading a "in between heart beat" or a " early heart beat" he wasn't sure of how it was actually termed. He will try to get his wife to send them in on mychart. He will let us know if he is not able to. Patient feels fine so complaints of symptoms.

## 2021-02-22 NOTE — Telephone Encounter (Signed)
As stated. Patient will try to upload the strips to mychart if unable they will let us know.

## 2021-02-22 NOTE — Telephone Encounter (Signed)
Spoke to the patient just now and let him know Dr. Hulen Shouts recommendations per his MyChart message. He verbalizes understanding and thanks me for calling them back.

## 2021-02-22 NOTE — Telephone Encounter (Signed)
Wife of patient called. Wife needs the office to contact her. 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

## 2021-02-22 NOTE — Telephone Encounter (Signed)
Please have him transmit the strips

## 2021-02-22 NOTE — Telephone Encounter (Signed)
Left message for patient to return call.

## 2021-02-22 NOTE — Telephone Encounter (Signed)
Spoke to patient wife she sent in strips on mychart and they have been routed to Dr. Dulce Sellar. Advised her once he advises on them we will call and let her know.

## 2021-03-28 ENCOUNTER — Other Ambulatory Visit: Payer: Self-pay

## 2021-03-28 NOTE — Progress Notes (Signed)
Cardiology Office Note:    Date:  03/29/2021   ID:  Erik Sutton, DOB 1966-07-12, MRN [id number]  PCP:  Ignacia Palma., MD  Cardiologist:  Norman Herrlich, MD    Referring MD: Ignacia Palma., MD    ASSESSMENT:    1. Paroxysmal atrial fibrillation (HCC)   2. High risk medication use   3. Essential hypertension   4. Mixed hyperlipidemia    PLAN:    In order of problems listed above:  He has had a very good response to flecainide good control of atrial fibrillation he has a low CHADS2 score 1 not anticoagulated we will continue flecainide check a blood level today.  With sinus bradycardia reduce beta-blocker by 50% to continue to trend monitor heart rate blood pressure at home Stable continue flecainide check level BP at target continue current treatment including beta-blocker and calcium channel blocker Stable continue with statin   Next appointment: 6 months   Medication Adjustments/Labs and Tests Ordered: Current medicines are reviewed at length with the patient today.  Concerns regarding medicines are outlined above.  No orders of the defined types were placed in this encounter.  No orders of the defined types were placed in this encounter.   Chief Complaint  Patient presents with   Follow-up   Atrial Fibrillation    He was initiated on flecainide last visit    History of Present Illness:    Erik Sutton is a 55 y.o. male with a hx of paroxysmal atrial fibrillation on flecainide and the patient has declined anticoagulation and hypertension last seen 12/31/2020. Compliance with diet, lifestyle and medications: Yes  He is improved very little palpitation and when he has episodes that are brief and not sustained.  He is pleased with the quality of his life no side effects from flecainide but at times his heart rate is 45-50 in the morning and will reduce his beta-blocker dose 50%.  No syncope chest pain edema shortness of breath. Past Medical History:  Diagnosis Date    Essential hypertension 09/03/2020   Hyperlipidemia    Hypertension     Past Surgical History:  Procedure Laterality Date   HERNIA REPAIR     SPINAL FUSION  2007   L4, L5    Current Medications: Current Meds  Medication Sig   ALPRAZolam (XANAX) 1 MG tablet Take 0.5 mg by mouth 3 (three) times daily.   amLODipine (NORVASC) 5 MG tablet Take 5 mg by mouth daily.   APPLE CIDER VINEGAR PO Take 450 mg by mouth daily.   Ascorbic Acid (VITAMIN C) 1000 MG tablet Take 1,000 mg by mouth daily.   aspirin EC 81 MG tablet Take 81 mg by mouth daily. Swallow whole.   cetirizine (ZYRTEC) 10 MG tablet Take 10 mg by mouth daily.   cholecalciferol (VITAMIN D3) 25 MCG (1000 UNIT) tablet Take 1,000 Units by mouth daily.   DANDELION PO Take 1,575 mg by mouth daily.   ELDERBERRY PO Take 50 mg by mouth daily.   Flaxseed, Linseed, (FLAXSEED OIL) 1000 MG CAPS Take 1,000 mg by mouth daily.   flecainide (TAMBOCOR) 50 MG tablet Take 1 tablet (50 mg total) by mouth 2 (two) times daily.   gabapentin (NEURONTIN) 300 MG capsule Take 300 mg by mouth 3 (three) times daily.   HYDROcodone-acetaminophen (NORCO/VICODIN) 5-325 MG tablet Take 1 tablet by mouth 2 (two) times daily as needed for pain.   lisinopril (ZESTRIL) 40 MG tablet Take 40 mg by mouth daily.  metoprolol succinate (TOPROL XL) 25 MG 24 hr tablet Take 1 tablet (25 mg total) by mouth daily.   Multiple Vitamins-Minerals (MULTIVITAMIN MEN PO) Take 1 tablet by mouth daily.   Omega-3 Fatty Acids (FISH OIL) 1000 MG CPDR Take 1,000 mg by mouth daily.   omeprazole (PRILOSEC) 20 MG capsule Take 20 mg by mouth daily.   rosuvastatin (CRESTOR) 10 MG tablet Take 10 mg by mouth at bedtime.   UNABLE TO FIND Take 1 tablet by mouth daily. NATURE'S BLEND STRESS   zolpidem (AMBIEN) 10 MG tablet Take 10 mg by mouth at bedtime as needed for sleep.     Allergies:   Latex   Social History   Socioeconomic History   Marital status: Married    Spouse name: Not on file    Number of children: Not on file   Years of education: Not on file   Highest education level: Not on file  Occupational History   Not on file  Tobacco Use   Smoking status: Never   Smokeless tobacco: Never  Vaping Use   Vaping Use: Never used  Substance and Sexual Activity   Alcohol use: Never   Drug use: Never   Sexual activity: Not on file  Other Topics Concern   Not on file  Social History Narrative   Not on file   Social Determinants of Health   Financial Resource Strain: Not on file  Food Insecurity: Not on file  Transportation Needs: Not on file  Physical Activity: Not on file  Stress: Not on file  Social Connections: Not on file     Family History: The patient's family history includes Atrial fibrillation in his mother; Diabetes in his father; Heart disease in his brother and father; Hypertension in his mother; Parkinson's disease in his paternal grandmother; Stroke in his mother. ROS:   Please see the history of present illness.    All other systems reviewed and are negative.  EKGs/Labs/Other Studies Reviewed:    The following studies were reviewed today:  EKG:  EKG ordered today and personally reviewed.  The ekg ordered today demonstrates sinus bradycardia 55 bpm no signs of 1C antiarrhythmic drug toxicity  Recent Labs:   Physical Exam:    VS:  BP 102/80 (BP Location: Left Arm, Patient Position: Sitting, Cuff Size: Normal)   Pulse (!) 55   Ht 6\' 2"  (1.88 m)   Wt 244 lb (110.7 kg)   SpO2 97%   BMI 31.33 kg/m     Wt Readings from Last 3 Encounters:  03/29/21 244 lb (110.7 kg)  12/31/20 243 lb 1.9 oz (110.3 kg)  10/28/20 252 lb (114.3 kg)     GEN:  Well nourished, well developed in no acute distress HEENT: Normal NECK: No JVD; No carotid bruits LYMPHATICS: No lymphadenopathy CARDIAC: RRR, no murmurs, rubs, gallops RESPIRATORY:  Clear to auscultation without rales, wheezing or rhonchi  ABDOMEN: Soft, non-tender, non-distended MUSCULOSKELETAL:  No  edema; No deformity  SKIN: Warm and dry NEUROLOGIC:  Alert and oriented x 3 PSYCHIATRIC:  Normal affect    Signed, 12/26/20, MD  03/29/2021 9:44 AM    [facility]

## 2021-03-29 ENCOUNTER — Other Ambulatory Visit: Payer: Self-pay

## 2021-03-29 ENCOUNTER — Telehealth: Payer: Self-pay

## 2021-03-29 ENCOUNTER — Ambulatory Visit: Payer: Federal, State, Local not specified - PPO | Admitting: Cardiology

## 2021-03-29 ENCOUNTER — Encounter: Payer: Self-pay | Admitting: Cardiology

## 2021-03-29 VITALS — BP 102/80 | HR 55 | Ht 74.0 in | Wt 244.0 lb

## 2021-03-29 DIAGNOSIS — Z79899 Other long term (current) drug therapy: Secondary | ICD-10-CM

## 2021-03-29 DIAGNOSIS — I10 Essential (primary) hypertension: Secondary | ICD-10-CM | POA: Diagnosis not present

## 2021-03-29 DIAGNOSIS — E782 Mixed hyperlipidemia: Secondary | ICD-10-CM | POA: Diagnosis not present

## 2021-03-29 DIAGNOSIS — I480 Paroxysmal atrial fibrillation: Secondary | ICD-10-CM

## 2021-03-29 MED ORDER — METOPROLOL SUCCINATE ER 25 MG PO TB24: 12.5000 mg | ORAL_TABLET | Freq: Every day | ORAL | 3 refills | Status: DC

## 2021-03-29 MED ORDER — FLECAINIDE ACETATE 50 MG PO TABS: 50.0000 mg | ORAL_TABLET | Freq: Two times a day (BID) | ORAL | 3 refills | Status: DC

## 2021-03-29 NOTE — Patient Instructions (Signed)
Medication Instructions:  Your physician has recommended you make the following change in your medication:   Decrease Toprol XL to 12.5 mg daily.  *If you need a refill on your cardiac medications before your next appointment, please call your pharmacy*   Lab Work: Your physician recommends that you have a flecainide level today.  If you have labs (blood work) drawn today and your tests are completely normal, you will receive your results only by: MyChart Message (if you have MyChart) OR A paper copy in the mail If you have any lab test that is abnormal or we need to change your treatment, we will call you to review the results.   Testing/Procedures: None ordered   Follow-Up: At [facility], you and your health needs are our priority.  As part of our continuing mission to provide you with exceptional heart care, we have created designated Provider Care Teams.  These Care Teams include your primary Cardiologist (physician) and Advanced Practice Providers (APPs -  Physician Assistants and Nurse Practitioners) who all work together to provide you with the care you need, when you need it.  We recommend signing up for the patient portal called "MyChart".  Sign up information is provided on this After Visit Summary.  MyChart is used to connect with patients for Virtual Visits (Telemedicine).  Patients are able to view lab/test results, encounter notes, upcoming appointments, etc.  Non-urgent messages can be sent to your provider as well.   To learn more about what you can do with MyChart, go to [email or web address].    Your next appointment:   6 month(s)  The format for your next appointment:   In Person  Provider:   Norman Herrlich, MD   Other Instructions NA

## 2021-03-29 NOTE — Addendum Note (Signed)
Addended by: Eleonore Chiquito on: 03/29/2021 10:15 AM   Modules accepted: Orders

## 2021-03-29 NOTE — Telephone Encounter (Signed)
Patient is needing a refill on his Flecainide. He said he only has 1 pill left. CB # [id number]

## 2021-03-29 NOTE — Addendum Note (Signed)
Addended by: Eleonore Chiquito on: 03/29/2021 09:57 AM   Modules accepted: Orders

## 2021-04-01 LAB — FLECAINIDE LEVEL: Flecainide: 0.27 ug/mL (ref 0.20–1.00)

## 2021-04-18 ENCOUNTER — Telehealth: Payer: Self-pay | Admitting: Cardiology

## 2021-04-18 NOTE — Telephone Encounter (Signed)
Pt states he has the Kardia App  on his Iphone and it's reading Premature Ventricular Contractions for the last 2 days but the patient is feeling fine with no problems.Pts wants to know what he should do. Pt states he is under a lot of stress right now and the stress could be contributing to the reading.

## 2021-04-18 NOTE — Telephone Encounter (Signed)
Left message on patients voicemail to please return our call.   

## 2021-04-19 NOTE — Telephone Encounter (Signed)
Left message on patients voicemail to please return our call.   

## 2021-04-20 NOTE — Telephone Encounter (Signed)
Left message on patients voicemail to please return our call.   I am closing the phone note at this time after trying to reach the patient with no success x3 days.   

## 2021-04-27 ENCOUNTER — Ambulatory Visit: Payer: Federal, State, Local not specified - PPO | Admitting: Cardiology

## 2021-05-16 ENCOUNTER — Telehealth: Payer: Self-pay | Admitting: Cardiology

## 2021-05-16 NOTE — Telephone Encounter (Signed)
   Pt returning is returning call, he said he forgot the name of the covid meds but he will call his pcp and will callback or will send the info to his mychart message

## 2021-05-16 NOTE — Telephone Encounter (Signed)
Patient states he just tested positive for COVID this morning (05/16/21). He wanted to know what medications Dr. Dulce Sellar would recommend for him that are safe to take. Please advise

## 2021-05-16 NOTE — Telephone Encounter (Signed)
Left message on patients voicemail to please return our call.   

## 2021-08-08 ENCOUNTER — Other Ambulatory Visit: Payer: Self-pay | Admitting: Cardiology

## 2021-10-12 ENCOUNTER — Encounter: Payer: Self-pay | Admitting: Cardiology

## 2021-10-12 ENCOUNTER — Other Ambulatory Visit: Payer: Self-pay

## 2021-10-12 ENCOUNTER — Ambulatory Visit: Payer: Federal, State, Local not specified - PPO | Admitting: Cardiology

## 2021-10-12 VITALS — BP 132/86 | HR 68 | Ht 74.0 in | Wt 247.1 lb

## 2021-10-12 DIAGNOSIS — I480 Paroxysmal atrial fibrillation: Secondary | ICD-10-CM

## 2021-10-12 DIAGNOSIS — Z79899 Other long term (current) drug therapy: Secondary | ICD-10-CM

## 2021-10-12 DIAGNOSIS — I10 Essential (primary) hypertension: Secondary | ICD-10-CM

## 2021-10-12 MED ORDER — APIXABAN 5 MG PO TABS: 5.0000 mg | ORAL_TABLET | Freq: Two times a day (BID) | ORAL | 3 refills | Status: DC

## 2021-10-12 MED ORDER — FLECAINIDE ACETATE 50 MG PO TABS: ORAL_TABLET | ORAL | 3 refills | Status: DC

## 2021-10-12 MED ORDER — METOPROLOL SUCCINATE ER 25 MG PO TB24: 25.0000 mg | ORAL_TABLET | Freq: Every day | ORAL | 3 refills | Status: DC

## 2021-10-12 NOTE — Progress Notes (Signed)
[facility] ,   Cardiology Office Note:    Date:  10/12/2021   ID:  Erik Sutton, DOB March 26, 1966, MRN [id number]  PCP:  Ignacia PalmaBeck, Mark C., MD  Cardiologist:  Norman HerrlichBrian Mahad Newstrom, MD    Referring MD: Ignacia PalmaBeck, Mark C., MD    ASSESSMENT:    1. Paroxysmal atrial fibrillation (HCC)   2. High risk medication use   3. Essential hypertension    PLAN:    In order of problems listed above:  Is continuing to have episodes of atrial fibrillation not severe except for an episode that lasted all night rate is controlled but symptomatic concerned we will increase the dose of flecainide come back check level and also check a CBC and a CMP.  We will also transition aspirin to Eliquis.  I have asked with the episodes continue to consider EP catheter ablation with his young age Stable hypertension continue treatment beta-blocker ACE inhibitor calcium channel blocker   Next appointment: 6 months   Medication Adjustments/Labs and Tests Ordered: Current medicines are reviewed at length with the patient today.  Concerns regarding medicines are outlined above.  Orders Placed This Encounter  Procedures   Flecainide level   EKG 12-Lead   Meds ordered this encounter  Medications   apixaban (ELIQUIS) 5 MG TABS tablet    Sig: Take 1 tablet (5 mg total) by mouth 2 (two) times daily.    Dispense:  180 tablet    Refill:  3   flecainide (TAMBOCOR) 50 MG tablet    Sig: Take one tablet by mouth daily in the morning, take two tablets by mouth daily in the evening.    Dispense:  270 tablet    Refill:  3   metoprolol succinate (TOPROL-XL) 25 MG 24 hr tablet    Sig: Take 1 tablet (25 mg total) by mouth daily.    Dispense:  90 tablet    Refill:  3    Chief Complaint  Patient presents with   Follow-up   Atrial Fibrillation    History of Present Illness:    Erik Sutton is a 56 y.o. male with a hx of paroxysmal atrial fibrillation maintaining sinus rhythm on flecainide patient has  declined anticoagulation he also has hypertension and hyper lipidemia last seen 03/29/2021.  He was bradycardic his beta-blocker dose was decreased by 50% as flecainide level was low normal 0.27. Compliance with diet, lifestyle and medications: He continues to have episodes of atrial fibrillation compliant with his flecainide and beta-blocker The episodes usually last short minutes but at times have lasted all night Remains concerned of risk of stroke ambivalent of anticoagulation but after a nice discussion has elected to transition aspirin to Eliquis His flecainide level was low at work and increase the evening dose to 100 mg come back in 2 weeks check a level and we will also do a CBC and CMP at that time No edema exercise intolerance chest pain or syncope I reviewed strips from the last month from his mobile Anguillakardia download, 6 documentations of atrial fibrillation some are in the same day. Past Medical History:  Diagnosis Date   Essential hypertension 09/03/2020   Hyperlipidemia    Hypertension     Past Surgical History:  Procedure Laterality Date   HERNIA REPAIR     SPINAL FUSION  2007   L4, L5    Current Medications: Current Meds  Medication Sig   ALPRAZolam (XANAX) 1 MG tablet Take 0.5 mg by mouth 3 (  three) times daily as needed for anxiety.   amLODipine (NORVASC) 5 MG tablet Take 5 mg by mouth daily.   apixaban (ELIQUIS) 5 MG TABS tablet Take 1 tablet (5 mg total) by mouth 2 (two) times daily.   cetirizine (ZYRTEC) 10 MG tablet Take 10 mg by mouth daily.   cholecalciferol (VITAMIN D3) 25 MCG (1000 UNIT) tablet Take 1,000 Units by mouth daily.   gabapentin (NEURONTIN) 300 MG capsule Take 300 mg by mouth 3 (three) times daily.   HYDROcodone-acetaminophen (NORCO/VICODIN) 5-325 MG tablet Take 1 tablet by mouth 2 (two) times daily as needed for pain.   lisinopril (ZESTRIL) 40 MG tablet Take 40 mg by mouth daily.   omeprazole (PRILOSEC) 20 MG capsule Take 20 mg by mouth daily.    rosuvastatin (CRESTOR) 10 MG tablet Take 10 mg by mouth at bedtime.   zolpidem (AMBIEN) 10 MG tablet Take 10 mg by mouth at bedtime as needed for sleep.   [DISCONTINUED] aspirin EC 81 MG tablet Take 81 mg by mouth daily. Swallow whole.   [DISCONTINUED] flecainide (TAMBOCOR) 50 MG tablet Take 1 tablet (50 mg total) by mouth 2 (two) times daily.   [DISCONTINUED] metoprolol succinate (TOPROL-XL) 25 MG 24 hr tablet TAKE 1 TABLET BY MOUTH ONCE DAILY     Allergies:   Latex   Social History   Socioeconomic History   Marital status: Married    Spouse name: Not on file   Number of children: Not on file   Years of education: Not on file   Highest education level: Not on file  Occupational History   Not on file  Tobacco Use   Smoking status: Never    Passive exposure: Never   Smokeless tobacco: Never  Vaping Use   Vaping Use: Never used  Substance and Sexual Activity   Alcohol use: Never   Drug use: Never   Sexual activity: Not on file  Other Topics Concern   Not on file  Social History Narrative   Not on file   Social Determinants of Health   Financial Resource Strain: Not on file  Food Insecurity: Not on file  Transportation Needs: Not on file  Physical Activity: Not on file  Stress: Not on file  Social Connections: Not on file     Family History: The patient's family history includes Atrial fibrillation in his mother; Diabetes in his father; Heart disease in his brother and father; Hypertension in his mother; Parkinson's disease in his paternal grandmother; Stroke in his mother. ROS:   Please see the history of present illness.    All other systems reviewed and are negative.  EKGs/Labs/Other Studies Reviewed:    The following studies were reviewed today:  EKG:  EKG ordered today and personally reviewed.  The ekg ordered today demonstrates sinus rhythm normal EKG  Recent Labs: No results found for requested labs within last 8760 hours.  Recent Lipid Panel No results  found for: CHOL, TRIG, HDL, CHOLHDL, VLDL, LDLCALC, LDLDIRECT  Physical Exam:    VS:  BP 132/86 (BP Location: Right Arm)    Pulse 68    Ht 6\' 2"  (1.88 m)    Wt 247 lb 1.9 oz (112.1 kg)    SpO2 97%    BMI 31.73 kg/m     Wt Readings from Last 3 Encounters:  10/12/21 247 lb 1.9 oz (112.1 kg)  03/29/21 244 lb (110.7 kg)  12/31/20 243 lb 1.9 oz (110.3 kg)     GEN:  Well nourished,  well developed in no acute distress HEENT: Normal NECK: No JVD; No carotid bruits LYMPHATICS: No lymphadenopathy CARDIAC: RRR, no murmurs, rubs, gallops RESPIRATORY:  Clear to auscultation without rales, wheezing or rhonchi  ABDOMEN: Soft, non-tender, non-distended MUSCULOSKELETAL:  No edema; No deformity  SKIN: Warm and dry NEUROLOGIC:  Alert and oriented x 3 PSYCHIATRIC:  Normal affect    Signed, Norman Herrlich, MD  10/12/2021 3:42 PM    [facility]

## 2021-10-12 NOTE — Patient Instructions (Signed)
Medication Instructions:  Your physician has recommended you make the following change in your medication:  START: Eliquis 5 mg take one tablet by mouth twice daily.  STOP: Aspirin INCREASE: Flecainide 50 mg by mouth daily in the morning, 100 mg daily in the evening.  *If you need a refill on your cardiac medications before your next appointment, please call your pharmacy*   Lab Work: Your physician recommends that you return for lab work in: 2 week Flecainide If you have labs (blood work) drawn today and your tests are completely normal, you will receive your results only by: MyChart Message (if you have MyChart) OR A paper copy in the mail If you have any lab test that is abnormal or we need to change your treatment, we will call you to review the results.   Testing/Procedures: None   Follow-Up: At [facility], you and your health needs are our priority.  As part of our continuing mission to provide you with exceptional heart care, we have created designated Provider Care Teams.  These Care Teams include your primary Cardiologist (physician) and Advanced Practice Providers (APPs -  Physician Assistants and Nurse Practitioners) who all work together to provide you with the care you need, when you need it.  We recommend signing up for the patient portal called "MyChart".  Sign up information is provided on this After Visit Summary.  MyChart is used to connect with patients for Virtual Visits (Telemedicine).  Patients are able to view lab/test results, encounter notes, upcoming appointments, etc.  Non-urgent messages can be sent to your provider as well.   To learn more about what you can do with MyChart, go to [email or web address].    Your next appointment:   6 month(s)  The format for your next appointment:   In Person  Provider:   Norman Herrlich, MD    Other Instructions

## 2021-11-16 ENCOUNTER — Other Ambulatory Visit: Payer: Self-pay

## 2021-11-16 DIAGNOSIS — I10 Essential (primary) hypertension: Secondary | ICD-10-CM

## 2021-11-16 DIAGNOSIS — I480 Paroxysmal atrial fibrillation: Secondary | ICD-10-CM

## 2021-11-16 DIAGNOSIS — Z79899 Other long term (current) drug therapy: Secondary | ICD-10-CM

## 2021-11-24 LAB — CBC
Hematocrit: 43.8 % (ref 37.5–51.0)
Hemoglobin: 14.9 g/dL (ref 13.0–17.7)
MCH: 30.3 pg (ref 26.6–33.0)
MCHC: 34 g/dL (ref 31.5–35.7)
MCV: 89 fL (ref 79–97)
Platelets: 302 10*3/uL (ref 150–450)
RBC: 4.91 x10E6/uL (ref 4.14–5.80)
RDW: 12.7 % (ref 11.6–15.4)
WBC: 6.6 10*3/uL (ref 3.4–10.8)

## 2021-11-24 LAB — COMPREHENSIVE METABOLIC PANEL
ALT: 14 IU/L (ref 0–44)
AST: 17 IU/L (ref 0–40)
Albumin/Globulin Ratio: 1.7 (ref 1.2–2.2)
Albumin: 4.6 g/dL (ref 3.8–4.9)
Alkaline Phosphatase: 50 IU/L (ref 44–121)
BUN/Creatinine Ratio: 22 — ABNORMAL HIGH (ref 9–20)
BUN: 25 mg/dL — ABNORMAL HIGH (ref 6–24)
Bilirubin Total: 0.5 mg/dL (ref 0.0–1.2)
CO2: 24 mmol/L (ref 20–29)
Calcium: 9.4 mg/dL (ref 8.7–10.2)
Chloride: 103 mmol/L (ref 96–106)
Creatinine, Ser: 1.12 mg/dL (ref 0.76–1.27)
Globulin, Total: 2.7 g/dL (ref 1.5–4.5)
Glucose: 129 mg/dL — ABNORMAL HIGH (ref 70–99)
Potassium: 4.6 mmol/L (ref 3.5–5.2)
Sodium: 141 mmol/L (ref 134–144)
Total Protein: 7.3 g/dL (ref 6.0–8.5)
eGFR: 77 mL/min/{1.73_m2} (ref >59)

## 2021-11-24 LAB — FLECAINIDE LEVEL: Flecainide: 0.61 ug/ml (ref 0.20–1.00)

## 2021-12-28 ENCOUNTER — Encounter: Payer: Self-pay | Admitting: Cardiology

## 2021-12-30 ENCOUNTER — Telehealth: Payer: Self-pay | Admitting: Cardiology

## 2021-12-30 NOTE — Telephone Encounter (Signed)
Pt's wife returning call for pt. Wife stated that pt received a call earlier but he was unable to answer due to him being at work. Wife would like a callback. Please advise ?

## 2022-01-02 NOTE — Telephone Encounter (Signed)
Unable to advise pt's wife who called.  ?

## 2022-04-16 ENCOUNTER — Encounter: Payer: Self-pay | Admitting: Cardiology

## 2022-04-17 ENCOUNTER — Encounter: Payer: Self-pay | Admitting: Cardiology

## 2022-04-17 ENCOUNTER — Ambulatory Visit: Payer: Federal, State, Local not specified - PPO | Admitting: Cardiology

## 2022-04-17 VITALS — BP 120/80 | HR 60 | Ht 74.0 in | Wt 249.0 lb

## 2022-04-17 DIAGNOSIS — I480 Paroxysmal atrial fibrillation: Secondary | ICD-10-CM | POA: Diagnosis not present

## 2022-04-17 DIAGNOSIS — Z79899 Other long term (current) drug therapy: Secondary | ICD-10-CM

## 2022-04-17 DIAGNOSIS — I10 Essential (primary) hypertension: Secondary | ICD-10-CM

## 2022-04-17 NOTE — Patient Instructions (Signed)

## 2022-04-17 NOTE — Progress Notes (Signed)
Cardiology Office Note:    Date:  04/17/2022   ID:  [name], DOB May 23, 1966, MRN [id number]  PCP:  Ignacia Palma., MD  Cardiologist:  Norman Herrlich, MD    Referring MD: Ignacia Palma., MD    ASSESSMENT:    1. Paroxysmal atrial fibrillation (HCC)   2. High risk medication use   3. Essential hypertension    PLAN:    In order of problems listed above:  He has done well with low-dose flecainide maintaining sinus rhythm we will continue 150 mg daily along with his beta-blocker and low-dose aspirin. Well-controlled high blood pressure continue multi regimen including calcium channel blocker ACE and beta-blocker   Next appointment: 6 months   Medication Adjustments/Labs and Tests Ordered: Current medicines are reviewed at length with the patient today.  Concerns regarding medicines are outlined above.  No orders of the defined types were placed in this encounter.  No orders of the defined types were placed in this encounter.   Chief Complaint  Patient presents with   Follow-up   Atrial Fibrillation    History of Present Illness:    Erik Sutton is a 56 y.o. male with a hx of paroxysmal atrial fibrillation maintaining sinus rhythm on flecainide he has declined anticoagulation, hypertension hyperlipidemia last seen 10/12/2021.  ` Compliance with diet, lifestyle and medications: Yes  He uses the mobile Kardia to monitor had 2 brief episodes of atrial fibrillation less than 20 minutes in May and no recurrence He decided not to be anticoagulated takes low-dose aspirin Home blood pressures well controlled average 116/74 He has had no side effects he still has some lower extremity.  He has had no side effects from flecainide I reviewed a total of 23 monitor strips  Past Medical History:  Diagnosis Date   Essential hypertension 09/03/2020   Hyperlipidemia    Hypertension     Past Surgical History:  Procedure Laterality Date   HERNIA REPAIR     SPINAL FUSION  2007    L4, L5    Current Medications: Current Meds  Medication Sig   ALPRAZolam (XANAX) 1 MG tablet Take 0.5 mg by mouth 3 (three) times daily as needed for anxiety.   amLODipine (NORVASC) 5 MG tablet Take 5 mg by mouth daily.   aspirin EC 81 MG tablet Take 81 mg by mouth daily. Swallow whole.   azelastine (ASTELIN) 0.1 % nasal spray Place 1 spray into both nostrils daily.   cetirizine (ZYRTEC) 10 MG tablet Take 10 mg by mouth daily.   cholecalciferol (VITAMIN D3) 25 MCG (1000 UNIT) tablet Take 1,000 Units by mouth daily.   flecainide (TAMBOCOR) 50 MG tablet Take one tablet by mouth daily in the morning, take two tablets by mouth daily in the evening.   gabapentin (NEURONTIN) 300 MG capsule Take 300 mg by mouth 3 (three) times daily.   HYDROcodone-acetaminophen (NORCO/VICODIN) 5-325 MG tablet Take 1 tablet by mouth 2 (two) times daily as needed for pain.   lisinopril (ZESTRIL) 40 MG tablet Take 40 mg by mouth daily.   metoprolol succinate (TOPROL-XL) 25 MG 24 hr tablet Take 1 tablet (25 mg total) by mouth daily.   omeprazole (PRILOSEC) 20 MG capsule Take 20 mg by mouth daily.   rosuvastatin (CRESTOR) 20 MG tablet Take 20 mg by mouth at bedtime.   zolpidem (AMBIEN CR) 12.5 MG CR tablet Take 12.5 mg by mouth at bedtime as needed.     Allergies:   Latex   Social History  Socioeconomic History   Marital status: Married    Spouse name: Not on file   Number of children: Not on file   Years of education: Not on file   Highest education level: Not on file  Occupational History   Not on file  Tobacco Use   Smoking status: Never    Passive exposure: Never   Smokeless tobacco: Never  Vaping Use   Vaping Use: Never used  Substance and Sexual Activity   Alcohol use: Never   Drug use: Never   Sexual activity: Not on file  Other Topics Concern   Not on file  Social History Narrative   Not on file   Social Determinants of Health   Financial Resource Strain: Not on file  Food Insecurity:  Not on file  Transportation Needs: Not on file  Physical Activity: Not on file  Stress: Not on file  Social Connections: Not on file     Family History: The patient's family history includes Atrial fibrillation in his mother; Diabetes in his father; Heart disease in his brother and father; Hypertension in his mother; Parkinson's disease in his paternal grandmother; Stroke in his mother. ROS:   Please see the history of present illness.    All other systems reviewed and are negative.  EKGs/Labs/Other Studies Reviewed:    The following studies were reviewed today:  Recent Labs: 11/16/2021: ALT 14; BUN 25; Creatinine, Ser 1.12; Hemoglobin 14.9; Platelets 302; Potassium 4.6; Sodium 141  Flecainide level 11/16/2021 with therapeutic 0.6 Recent Lipid Panel No results found for: "CHOL", "TRIG", "HDL", "CHOLHDL", "VLDL", "LDLCALC", "LDLDIRECT"  Physical Exam:    VS:  BP 120/80 (BP Location: Left Arm, Patient Position: Sitting, Cuff Size: Normal)   Pulse 60   Ht 6\' 2"  (1.88 m)   Wt 249 lb (112.9 kg)   SpO2 97%   BMI 31.97 kg/m     Wt Readings from Last 3 Encounters:  04/17/22 249 lb (112.9 kg)  10/12/21 247 lb 1.9 oz (112.1 kg)  03/29/21 244 lb (110.7 kg)     GEN:  Well nourished, well developed in no acute distress HEENT: Normal NECK: No JVD; No carotid bruits LYMPHATICS: No lymphadenopathy CARDIAC: RRR, no murmurs, rubs, gallops RESPIRATORY:  Clear to auscultation without rales, wheezing or rhonchi  ABDOMEN: Soft, non-tender, non-distended MUSCULOSKELETAL:  No edema; No deformity  SKIN: Warm and dry NEUROLOGIC:  Alert and oriented x 3 PSYCHIATRIC:  Normal affect    Signed, 05/30/21, MD  04/17/2022 3:14 PM    [facility]

## 2022-10-23 ENCOUNTER — Other Ambulatory Visit: Payer: Self-pay

## 2022-10-23 MED ORDER — FLECAINIDE ACETATE 50 MG PO TABS: ORAL_TABLET | ORAL | 3 refills | Status: DC

## 2022-10-23 NOTE — Telephone Encounter (Signed)
Flecainide 50 mg # 270 tablets x 3 refills sent to [facility]

## 2022-10-30 NOTE — Progress Notes (Unsigned)
Cardiology Office Note:    Date:  10/31/2022    ID:  Erik Sutton, DOB [birth year], MRN [id number]  PCP:  Erik Sutton., MD  Cardiologist:  Shirlee More, MD    Referring MD: Erik Sutton., MD    ASSESSMENT:    1. Paroxysmal atrial fibrillation (HCC)   2. High risk medication use   3. Essential hypertension    PLAN:    In order of problems listed above:  He is doing well with current regimen flecainide and low-dose beta-blocker he is not bradycardic with home checks and is mobile cardiac continue the same medications he is not anticoagulated his decision and I will plan to see him in the office in 9 months Continue his current antihypertensive I encouraged him and his activity and weight loss   Next appointment: 9 months   Medication Adjustments/Labs and Tests Ordered: Current medicines are reviewed at length with the patient today.  Concerns regarding medicines are outlined above.  Orders Placed This Encounter  Procedures   EKG 12-Lead   Meds ordered this encounter  Medications   metoprolol succinate (TOPROL XL) 25 MG 24 hr tablet    Sig: Take 1 tablet (25 mg total) by mouth every other day.    Dispense:  45 tablet    Refill:  3    Chief Complaint  Patient presents with   Follow-up   Atrial Fibrillation    History of Present Illness:    Erik Sutton is a 57 y.o. male with a hx of paroxysmal atrial fibrillation maintaining sinus rhythm on flecainide and has declined anticoagulation hypertension and hyperlipidemia self-monitoring at home with mobile Kardia device last seen 04/17/2022. Compliance with diet, lifestyle and medications: Yes  His wife is present participates in evaluation decision making.  I utilized their phone and reviewing 1 month of mobile cardia's all sinus rhythm.  He did have 1 episode of brief 10-minute atrial fibrillation documented since last visit during that time he had forgotten to take his beta-blocker He has no side effects from  flecainide he feels well he is lost weight he wants to get back to regular physical activity He did have palpitation when he had atrial fibrillation otherwise no episodes no syncope edema shortness of breath or chest pain Past Medical History:  Diagnosis Date   Essential hypertension 09/03/2020   Hyperlipidemia    Hypertension     Past Surgical History:  Procedure Laterality Date   HERNIA REPAIR     SPINAL FUSION  2007   L4, L5    Current Medications: Current Meds  Medication Sig   ALPRAZolam (XANAX) 1 MG tablet Take 0.5 mg by mouth 3 (three) times daily as needed for anxiety.   amLODipine (NORVASC) 5 MG tablet Take 5 mg by mouth daily.   aspirin EC 81 MG tablet Take 81 mg by mouth daily. Swallow whole.   cholecalciferol (VITAMIN D3) 25 MCG (1000 UNIT) tablet Take 1,000 Units by mouth daily.   flecainide (TAMBOCOR) 50 MG tablet Take one tablet by mouth daily in the morning, take two tablets by mouth daily in the evening.   gabapentin (NEURONTIN) 300 MG capsule Take 300 mg by mouth 3 (three) times daily.   HYDROcodone-acetaminophen (NORCO/VICODIN) 5-325 MG tablet Take 1 tablet by mouth 2 (two) times daily as needed for pain.   lisinopril (ZESTRIL) 40 MG tablet Take 40 mg by mouth daily.   metoprolol succinate (TOPROL XL) 25 MG 24 hr tablet Take 1 tablet (25 mg total)  by mouth every other day.   rosuvastatin (CRESTOR) 20 MG tablet Take 20 mg by mouth at bedtime.   zolpidem (AMBIEN CR) 12.5 MG CR tablet Take 12.5 mg by mouth at bedtime as needed.   [DISCONTINUED] metoprolol succinate (TOPROL-XL) 25 MG 24 hr tablet Take 1 tablet (25 mg total) by mouth daily.     Allergies:   Latex   Social History   Socioeconomic History   Marital status: Married    Spouse name: Not on file   Number of children: Not on file   Years of education: Not on file   Highest education level: Not on file  Occupational History   Not on file  Tobacco Use   Smoking status: Never    Passive exposure:  Never   Smokeless tobacco: Never  Vaping Use   Vaping Use: Never used  Substance and Sexual Activity   Alcohol use: Never   Drug use: Never   Sexual activity: Not on file  Other Topics Concern   Not on file  Social History Narrative   Not on file   Social Determinants of Health   Financial Resource Strain: Not on file  Food Insecurity: Not on file  Transportation Needs: Not on file  Physical Activity: Not on file  Stress: Not on file  Social Connections: Not on file     Family History: The patient's family history includes Atrial fibrillation in his mother; Diabetes in his father; Heart disease in his brother and father; Hypertension in his mother; Parkinson's disease in his paternal grandmother; Stroke in his mother. ROS:   Please see the history of present illness.    All other systems reviewed and are negative.  EKGs/Labs/Other Studies Reviewed:    The following studies were reviewed today:  EKG:  EKG ordered today and personally reviewed.  The ekg ordered today demonstrates sinus bradycardia 47 bpm otherwise normal.  Recent Labs: 11/16/2021: ALT 14; BUN 25; Creatinine, Ser 1.12; Hemoglobin 14.9; Platelets 302; Potassium 4.6; Sodium 141    Physical Exam:    VS:  BP 110/80 (BP Location: Right Arm, Patient Position: Sitting, Cuff Size: Normal)   Pulse (!) 47   Ht 6' 2"$  (1.88 m)   Wt 233 lb (105.7 kg)   SpO2 99%   BMI 29.92 kg/m     Wt Readings from Last 3 Encounters:  10/31/22 233 lb (105.7 kg)  04/17/22 249 lb (112.9 kg)  10/12/21 247 lb 1.9 oz (112.1 kg)     GEN:  Well nourished, well developed in no acute distress HEENT: Normal NECK: No JVD; No carotid bruits LYMPHATICS: No lymphadenopathy CARDIAC: RRR, no murmurs, rubs, gallops RESPIRATORY:  Clear to auscultation without rales, wheezing or rhonchi  ABDOMEN: Soft, non-tender, non-distended MUSCULOSKELETAL:  No edema; No deformity  SKIN: Warm and dry NEUROLOGIC:  Alert and oriented x 3 PSYCHIATRIC:   Normal affect   Seen with Darius Bump CMA chaperone  Signed, Shirlee More, MD  10/31/2022 1:39 PM    [facility]

## 2022-10-31 ENCOUNTER — Encounter: Payer: Self-pay | Admitting: Cardiology

## 2022-10-31 ENCOUNTER — Other Ambulatory Visit: Payer: Self-pay

## 2022-10-31 ENCOUNTER — Ambulatory Visit: Payer: Federal, State, Local not specified - PPO | Attending: Cardiology | Admitting: Cardiology

## 2022-10-31 VITALS — BP 110/80 | HR 47 | Ht 74.0 in | Wt 233.0 lb

## 2022-10-31 DIAGNOSIS — I10 Essential (primary) hypertension: Secondary | ICD-10-CM | POA: Diagnosis not present

## 2022-10-31 DIAGNOSIS — I480 Paroxysmal atrial fibrillation: Secondary | ICD-10-CM

## 2022-10-31 DIAGNOSIS — Z79899 Other long term (current) drug therapy: Secondary | ICD-10-CM | POA: Diagnosis not present

## 2022-10-31 MED ORDER — METOPROLOL SUCCINATE ER 25 MG PO TB24: 25.0000 mg | ORAL_TABLET | ORAL | 3 refills | Status: DC

## 2022-10-31 NOTE — Patient Instructions (Addendum)
Medication Instructions:  Your physician has recommended you make the following change in your medication:   START: Metoprolol succinate 25 mg every other day  *If you need a refill on your cardiac medications before your next appointment, please call your pharmacy*   Lab Work: None If you have labs (blood work) drawn today and your tests are completely normal, you will receive your results only by: [place] (if you have MyChart) OR A paper copy in the mail If you have any lab test that is abnormal or we need to change your treatment, we will call you to review the results.   Testing/Procedures: None   Follow-Up: At [facility], you and your health needs are our priority.  As part of our continuing mission to provide you with exceptional heart care, we have created designated Provider Care Teams.  These Care Teams include your primary Cardiologist (physician) and Advanced Practice Providers (APPs -  Physician Assistants and Nurse Practitioners) who all work together to provide you with the care you need, when you need it.  We recommend signing up for the patient portal called "MyChart".  Sign up information is provided on this After Visit Summary.  MyChart is used to connect with patients for Virtual Visits (Telemedicine).  Patients are able to view lab/test results, encounter notes, upcoming appointments, etc.  Non-urgent messages can be sent to your provider as well.   To learn more about what you can do with MyChart, go to [email or web address].    Your next appointment:   9 month(s)  Provider:   Shirlee More, MD    Other Instructions None  This visit was accompanied by Darius Bump, CMA.

## 2023-08-02 ENCOUNTER — Ambulatory Visit: Payer: Federal, State, Local not specified - PPO | Admitting: Cardiology

## 2023-08-28 ENCOUNTER — Encounter: Payer: Self-pay | Admitting: Cardiology

## 2023-08-28 NOTE — Progress Notes (Unsigned)
Cardiology Office Note:    Date:  08/29/2023   ID:  Erik Sutton, DOB 06-18-1966, MRN [id number]  PCP:  Bertis Ruddy, PA-C  Cardiologist:  Norman Herrlich, MD    Referring MD: Ignacia Palma., MD    ASSESSMENT:    1. Paroxysmal atrial fibrillation (HCC)   2. High risk medication use   3. Essential hypertension   4. Mixed hyperlipidemia    PLAN:    In order of problems listed above:  He continues to do well maintaining sinus rhythm on low-dose flecainide I will do an EKG to recycle and with his self monitoring at home I feel safe seeing him in the office in 1 year and follow-up If recurrent atrial fibrillation transition aspirin to Eliquis Well-controlled hypertension continue his current treatment ACE inhibitor beta-blocker Continue his high intensity statin labs are followed in his PCP office   Next appointment: 1 year   Medication Adjustments/Labs and Tests Ordered: Current medicines are reviewed at length with the patient today.  Concerns regarding medicines are outlined above.  Orders Placed This Encounter  Procedures   EKG 12-Lead   No orders of the defined types were placed in this encounter.    History of Present Illness:    Erik Sutton is a 57 y.o. male with a hx of paroxysmal atrial fibrillation maintaining sinus rhythm with flecainide patient has opted against anticoagulation other problems include hypertension and hyperlipidemia last seen 10/31/2022.  He continues to do well he brought in information about his heart rate and blood pressures he self monitors at home with the mobile Kardia on a routine basis once or twice a day no episodes of atrial fibrillation and blood pressure runs average 118/71 heart rate of 58 bpm No side effects from his flecainide or beta-blocker He takes low-dose aspirin for stroke prophylaxis He has a [facility] PCP at [facility] and has had full labs recently performed  Compliance with diet, lifestyle and medications: Yes Past Medical  History:  Diagnosis Date   Essential hypertension 09/03/2020   Hyperlipidemia    Hypertension     Current Medications: Current Meds  Medication Sig   ALPRAZolam (XANAX) 1 MG tablet Take 0.5 mg by mouth 3 (three) times daily as needed for anxiety.   amLODipine (NORVASC) 5 MG tablet Take 5 mg by mouth daily.   aspirin EC 81 MG tablet Take 81 mg by mouth daily. Swallow whole.   cholecalciferol (VITAMIN D3) 25 MCG (1000 UNIT) tablet Take 1,000 Units by mouth daily.   flecainide (TAMBOCOR) 50 MG tablet Take one tablet by mouth daily in the morning, take two tablets by mouth daily in the evening.   gabapentin (NEURONTIN) 300 MG capsule Take 300 mg by mouth 3 (three) times daily.   HYDROcodone-acetaminophen (NORCO/VICODIN) 5-325 MG tablet Take 1 tablet by mouth 2 (two) times daily as needed for pain.   lisinopril (ZESTRIL) 40 MG tablet Take 40 mg by mouth daily.   metoprolol succinate (TOPROL-XL) 25 MG 24 hr tablet Take 1 tablet (25 mg total) by mouth every other day.   rosuvastatin (CRESTOR) 20 MG tablet Take 20 mg by mouth at bedtime.   zolpidem (AMBIEN CR) 12.5 MG CR tablet Take 12.5 mg by mouth at bedtime as needed.   [DISCONTINUED] metoprolol succinate (TOPROL XL) 25 MG 24 hr tablet Take 1 tablet (25 mg total) by mouth every other day.      EKGs/Labs/Other Studies Reviewed:    The following studies were reviewed today:  EKG Interpretation Date/Time:  Wednesday August 29 2023 15:31:58 EST Ventricular Rate:  79 PR Interval:  166 QRS Duration:  84 QT Interval:  370 QTC Calculation: 424 R Axis:   26  Text Interpretation: Normal sinus rhythm Nonspecific T wave abnormality Abnormal ECG No previous ECGs available Confirmed by Norman Herrlich ([id number]) on 08/29/2023 3:52:43 PM    Physical Exam:    VS:  BP (!) 142/84 (BP Location: Right Arm, Patient Position: Bed low/side rails up)   Pulse 77   Ht 6\' 2"  (1.88 m)   Wt 225 lb 6.4 oz (102.2 kg)   SpO2 95%   BMI 28.94 kg/m     Wt  Readings from Last 3 Encounters:  08/29/23 225 lb 6.4 oz (102.2 kg)  10/31/22 233 lb (105.7 kg)  04/17/22 249 lb (112.9 kg)     GEN:  Well nourished, well developed in no acute distress HEENT: Normal NECK: No JVD; No carotid bruits LYMPHATICS: No lymphadenopathy CARDIAC: RRR, no murmurs, rubs, gallops RESPIRATORY:  Clear to auscultation without rales, wheezing or rhonchi  ABDOMEN: Soft, non-tender, non-distended MUSCULOSKELETAL:  No edema; No deformity  SKIN: Warm and dry NEUROLOGIC:  Alert and oriented x 3 PSYCHIATRIC:  Normal affect    Signed, Norman Herrlich, MD  08/29/2023 3:52 PM    [facility]

## 2023-08-29 ENCOUNTER — Encounter: Payer: Self-pay | Admitting: Cardiology

## 2023-08-29 ENCOUNTER — Ambulatory Visit: Payer: Federal, State, Local not specified - PPO | Attending: Cardiology | Admitting: Cardiology

## 2023-08-29 ENCOUNTER — Other Ambulatory Visit: Payer: Self-pay | Admitting: Cardiology

## 2023-08-29 VITALS — BP 142/84 | HR 77 | Ht 74.0 in | Wt 225.4 lb

## 2023-08-29 DIAGNOSIS — E782 Mixed hyperlipidemia: Secondary | ICD-10-CM | POA: Diagnosis not present

## 2023-08-29 DIAGNOSIS — I480 Paroxysmal atrial fibrillation: Secondary | ICD-10-CM | POA: Diagnosis not present

## 2023-08-29 DIAGNOSIS — I10 Essential (primary) hypertension: Secondary | ICD-10-CM | POA: Diagnosis not present

## 2023-08-29 DIAGNOSIS — Z79899 Other long term (current) drug therapy: Secondary | ICD-10-CM | POA: Diagnosis not present

## 2023-08-29 NOTE — Patient Instructions (Signed)

## 2023-09-17 ENCOUNTER — Encounter: Payer: Self-pay | Admitting: Cardiology

## 2023-09-18 ENCOUNTER — Encounter: Payer: Self-pay | Admitting: Cardiology

## 2023-10-15 ENCOUNTER — Other Ambulatory Visit (HOSPITAL_COMMUNITY): Payer: Self-pay

## 2023-10-15 ENCOUNTER — Telehealth: Payer: Self-pay | Admitting: Cardiology

## 2023-10-15 MED ORDER — FLECAINIDE ACETATE 50 MG PO TABS
ORAL_TABLET | ORAL | 3 refills | Status: DC
  Filled 2023-10-15: qty 270, 90d supply, fill #0

## 2023-10-15 NOTE — Telephone Encounter (Signed)
Rx refill sent to pharmacy.

## 2023-10-15 NOTE — Telephone Encounter (Signed)
*  STAT* If patient is at the pharmacy, call can be transferred to refill team.   1. Which medications need to be refilled? (please list name of each medication and dose if known) flecainide (TAMBOCOR) 50 MG tablet   2. Which pharmacy/location (including street and city if local pharmacy) is medication to be sent to?  [facility] - [place], [place] - [address] Phone: [phone]  Fax: [phone]      3. Do they need a 30 day or 90 day supply? 90

## 2023-10-16 ENCOUNTER — Other Ambulatory Visit (HOSPITAL_COMMUNITY): Payer: Self-pay

## 2023-10-16 ENCOUNTER — Telehealth: Payer: Self-pay | Admitting: Cardiology

## 2023-10-16 MED ORDER — FLECAINIDE ACETATE 50 MG PO TABS: ORAL_TABLET | ORAL | 3 refills | Status: DC

## 2023-10-16 MED ORDER — METOPROLOL SUCCINATE ER 25 MG PO TB24: 25.0000 mg | ORAL_TABLET | ORAL | 2 refills | Status: DC

## 2023-10-16 NOTE — Telephone Encounter (Signed)
*  STAT* If patient is at the pharmacy, call can be transferred to refill team.   1. Which medications need to be refilled? (please list name of each medication and dose if known) flecainide (TAMBOCOR) 50 MG tablet    2. Would you like to learn more about the convenience, safety, & potential cost savings by using the [facility]?      3. Are you open to using the [facility] (Type [facility].  ).   4. Which pharmacy/location (including street and city if local pharmacy) is medication to be sent to? [facility] - [place], [place] - [address]    5. Do they need a 30 day or 90 day supply? 90

## 2023-10-16 NOTE — Addendum Note (Signed)
Addended by: Samyukta Cura, Elmarie Shiley L on: 10/16/2023 12:52 PM   Modules accepted: Orders

## 2023-10-16 NOTE — Telephone Encounter (Signed)
Spoke with patient and let him know we had sent the Flecainide to the [facility] on [address] in [place].  I also sent refill of his metoprolol to keep the in sync with being picked up per patient's request.  He thanked me for the call and had no additional questions.

## 2023-10-16 NOTE — Telephone Encounter (Signed)
Resent prescription for Flecainide to [facility] in [place].  Called to let Erik Sutton know it was sent in.  Left voice mail to call back if she has any questions

## 2023-10-16 NOTE — Telephone Encounter (Signed)
*  STAT* If patient is at the pharmacy, call can be transferred to refill team.   1. Which medications need to be refilled? (please list name of each medication and dose if known)   flecainide (TAMBOCOR) 50 MG tablet   2. Would you like to learn more about the convenience, safety, & potential cost savings by using the [facility]?   3. Are you open to using the [facility] (Type [facility]. ).  4. Which pharmacy/location (including street and city if local pharmacy) is medication to be sent to?  [facility] - [place], [place] - [address]   5. Do they need a 30 day or 90 day supply?  90 day  Wife (Vickie) stated patient is completely out of this medication and wants to confirm medication was sent to [facility] on [address], [place].

## 2023-11-29 ENCOUNTER — Other Ambulatory Visit: Payer: Self-pay | Admitting: Cardiology

## 2024-02-05 ENCOUNTER — Encounter: Payer: Self-pay | Admitting: Cardiology

## 2024-09-05 NOTE — Progress Notes (Signed)
 " Cardiology Office Note:    Date:  09/10/2024   ID:  Erik Sutton, DOB June 14, 1966, MRN [id number]  PCP:  Rosalva Jacobsen, PA-C  Cardiologist:  Redell Leiter, MD    Referring MD: Rosalva Jacobsen, PA-C    ASSESSMENT:    1. Paroxysmal atrial fibrillation (HCC)   2. High risk medication use   3. Essential hypertension   4. Mixed hyperlipidemia    PLAN:    In order of problems listed above:  Erik Sutton continues to do well with antiarrhythmic therapy for his atrial arrhythmia little or no recurrence in the last year reassured check himself with a mobile Kardia he will continue low-dose flecainide  normal QRS duration his EKG avoid over-the-counter proarrhythmic drugs I will see back in 1 year BP at target continue current treatment ACE beta-blocker Continue his statin   Next appointment: 1 year     Medication Adjustments/Labs and Tests Ordered: Current medicines are reviewed at length with the patient today.  Concerns regarding medicines are outlined above.  Orders Placed This Encounter  Procedures   EKG 12-Lead   No orders of the defined types were placed in this encounter.    History of Present Illness:    Erik Sutton is a 58 y.o. male with a hx of paroxysmal atrial fibrillation maintaining sinus rhythm with flecainide  the patient has opted not to take an anticoagulation hypertension and hyperlipidemia last seen 08/29/2023.  Follows himself I reviewed his list of blood pressures average 120/78 pulse 60/min He has had no episode of atrial fibrillation since early in the year at least 10 months Still at times with anxiety he is aware of his heart he does a quick check with the mobile Kardia and he is reassured He has had no sustained palpitations syncope chest pain shortness of breath or edema He avoids over-the-counter proarrhythmic drugs  Compliance with diet, lifestyle and medications: Yes  Past Medical History:  Diagnosis Date   Essential hypertension 09/03/2020    Hyperlipidemia    Hypertension     Current Medications: Active Medications[1]    EKGs/Labs/Other Studies Reviewed:    The following studies were reviewed today:  Cardiac Studies & Procedures   ______________________________________________________________________________________________     ECHOCARDIOGRAM  ECHOCARDIOGRAM COMPLETE 11/29/2020  Narrative ECHOCARDIOGRAM REPORT    Patient Name:   Erik Sutton Date of Exam: 11/29/2020 Medical Rec #:  [id number]    Height:       74.0 in Accession #:    [id number]   Weight:       252.0 lb Date of Birth:  [birth year]   BSA:          2.398 m Patient Age:    54 years     BP:           118/84 mmHg Patient Gender: M            HR:           64 bpm. Exam Location:  [facility]  Procedure: 2D Echo  Indications:    Atrial fibrillation (HCC) [427.31.ICD-9-CM]  History:        Patient has no prior history of Echocardiogram examinations. Risk Factors:Hypertension and Dyslipidemia.  Sonographer:    Lynwood Silvas Referring Phys: [phone] Bryttney Netzer J Arnetra Terris  IMPRESSIONS   1. Left ventricular ejection fraction, by estimation, is 55 to 60%. The left ventricle has normal function. The left ventricle has no regional wall motion abnormalities. Left ventricular diastolic parameters are consistent with Grade I diastolic dysfunction (impaired relaxation). 2.  Right ventricular systolic function is normal. The right ventricular size is normal. There is normal pulmonary artery systolic pressure. 3. The mitral valve is normal in structure. No evidence of mitral valve regurgitation. No evidence of mitral stenosis. 4. The aortic valve is tricuspid. Aortic valve regurgitation is not visualized. Mild aortic valve sclerosis is present, with no evidence of aortic valve stenosis. 5. The inferior vena cava is normal in size with greater than 50% respiratory variability, suggesting right atrial pressure of 3 mmHg.  FINDINGS Left Ventricle: Left ventricular ejection  fraction, by estimation, is 55 to 60%. The left ventricle has normal function. The left ventricle has no regional wall motion abnormalities. The left ventricular internal cavity size was normal in size. There is no left ventricular hypertrophy. Left ventricular diastolic parameters are consistent with Grade I diastolic dysfunction (impaired relaxation). Normal left ventricular filling pressure.  Right Ventricle: The right ventricular size is normal. No increase in right ventricular wall thickness. Right ventricular systolic function is normal. There is normal pulmonary artery systolic pressure. The tricuspid regurgitant velocity is 2.05 m/s, and with an assumed right atrial pressure of 3 mmHg, the estimated right ventricular systolic pressure is 19.8 mmHg.  Left Atrium: Left atrial size was normal in size.  Right Atrium: Right atrial size was normal in size.  Pericardium: There is no evidence of pericardial effusion.  Mitral Valve: The mitral valve is normal in structure. Mild mitral annular calcification. No evidence of mitral valve regurgitation. No evidence of mitral valve stenosis.  Tricuspid Valve: The tricuspid valve is normal in structure. Tricuspid valve regurgitation is trivial. No evidence of tricuspid stenosis.  Aortic Valve: The aortic valve is tricuspid. Aortic valve regurgitation is not visualized. Mild aortic valve sclerosis is present, with no evidence of aortic valve stenosis.  Pulmonic Valve: The pulmonic valve was normal in structure. Pulmonic valve regurgitation is not visualized. No evidence of pulmonic stenosis.  Aorta: The aortic root and ascending aorta are structurally normal, with no evidence of dilitation.  Venous: The pulmonary veins were not well visualized. The inferior vena cava is normal in size with greater than 50% respiratory variability, suggesting right atrial pressure of 3 mmHg.  IAS/Shunts: No atrial level shunt detected by color flow Doppler.   LEFT  VENTRICLE PLAX 2D LVIDd:         4.60 cm     Diastology LVIDs:         2.85 cm     LV e' medial:    8.16 cm/s LV PW:         0.90 cm     LV E/e' medial:  10.6 LV IVS:        0.90 cm     LV e' lateral:   11.90 cm/s LVOT diam:     2.00 cm     LV E/e' lateral: 7.2 LV SV:         61 LV SV Index:   26 LVOT Area:     3.14 cm  LV Volumes (MOD) LV vol d, MOD A2C: 55.4 ml LV vol d, MOD A4C: 61.7 ml LV vol s, MOD A2C: 21.7 ml LV vol s, MOD A4C: 29.2 ml LV SV MOD A2C:     33.7 ml LV SV MOD A4C:     61.7 ml LV SV MOD BP:      33.5 ml  RIGHT VENTRICLE            IVC RV S prime:     9.14  cm/s  IVC diam: 1.40 cm TAPSE (M-mode): 2.2 cm  LEFT ATRIUM           Index       RIGHT ATRIUM           Index LA diam:      3.50 cm 1.46 cm/m  RA Area:     15.40 cm LA Vol (A4C): 56.1 ml 23.40 ml/m RA Volume:   39.80 ml  16.60 ml/m AORTIC VALVE LVOT Vmax:   84.20 cm/s LVOT Vmean:  63.700 cm/s LVOT VTI:    0.195 m  AORTA Ao Root diam: 3.20 cm Ao Asc diam:  3.00 cm Ao Desc diam: 2.20 cm  MITRAL VALVE               TRICUSPID VALVE MV Area (PHT): 4.52 cm    TR Peak grad:   16.8 mmHg MV Decel Time: 168 msec    TR Vmax:        205.00 cm/s MV E velocity: 86.10 cm/s MV A velocity: 75.00 cm/s  SHUNTS MV E/A ratio:  1.15        Systemic VTI:  0.20 m Systemic Diam: 2.00 cm  Redell Leiter MD Electronically signed by Redell Leiter MD Signature Date/Time: 11/29/2020/12:05:48 PM    Final    MONITORS  LONG TERM MONITOR (3-14 DAYS) 09/27/2020  Narrative Patch Wear Time:  7 days and 2 hours (2021-12-17T11:21:16-0500 to 2021-12-24T13:39:13-0500)  Patient had a min HR of 44 bpm, max HR of 185 bpm, and avg HR of 77 bpm. Predominant underlying rhythm was Sinus Rhythm. Slight P wave morphology changes were noted. 3 Supraventricular Tachycardia runs occurred, the run with the fastest interval lasting 4 beats with a max rate of 139 bpm, the longest lasting 11 beats with an avg rate of 107 bpm. Atrial  Fibrillation occurred (20% burden), ranging from 44-185 bpm (avg of 89 bpm), the longest lasting 4 hours 40 mins with an avg rate of 76 bpm. Isolated SVEs were rare (<1.0%), SVE Couplets were rare (<1.0%), and SVE Triplets were rare (<1.0%). Isolated VEs were rare (<1.0%), VE Couplets were rare (<1.0%), and no VE Triplets were present.       ______________________________________________________________________________________________      EKG Interpretation Date/Time:  Wednesday September 10 2024 08:04:49 EST Ventricular Rate:  83 PR Interval:  174 QRS Duration:  86 QT Interval:  380 QTC Calculation: 446 R Axis:   92  Text Interpretation: Normal sinus rhythm Rightward axis When compared with ECG of 29-Aug-2023 15:31, No significant change since last tracing Confirmed by Leiter Redell ([id number]) on 09/10/2024 8:09:02 AM     Physical Exam:    VS:  BP 120/84   Pulse 83   Ht 6' 2 (1.88 m)   SpO2 99%   BMI 28.94 kg/m     Wt Readings from Last 3 Encounters:  08/29/23 225 lb 6.4 oz (102.2 kg)  10/31/22 233 lb (105.7 kg)  04/17/22 249 lb (112.9 kg)     GEN:  Well nourished, well developed in no acute distress HEENT: Normal NECK: No JVD; No carotid bruits LYMPHATICS: No lymphadenopathy CARDIAC: RRR, no murmurs, rubs, gallops RESPIRATORY:  Clear to auscultation without rales, wheezing or rhonchi  ABDOMEN: Soft, non-tender, non-distended MUSCULOSKELETAL:  No edema; No deformity  SKIN: Warm and dry NEUROLOGIC:  Alert and oriented x 3 PSYCHIATRIC:  Normal affect    Signed, Redell Leiter, MD  09/10/2024 8:19 AM    [facility]      [  1]  Current Meds  Medication Sig   ALPRAZolam (XANAX) 0.5 MG tablet Take 0.5 mg by mouth 3 (three) times daily.   amLODipine (NORVASC) 5 MG tablet Take 5 mg by mouth daily.   aspirin EC 81 MG tablet Take 81 mg by mouth daily. Swallow whole.   cyclobenzaprine (FLEXERIL) 10 MG tablet Take 10 mg by mouth at bedtime.    flecainide  (TAMBOCOR ) 50 MG tablet Take 1 tablet (50 mg total) by mouth in the morning AND 2 tablets (100 mg total) every evening.   gabapentin (NEURONTIN) 300 MG capsule Take 300 mg by mouth 3 (three) times daily.   HYDROcodone-acetaminophen (NORCO/VICODIN) 5-325 MG tablet Take 1 tablet by mouth 2 (two) times daily as needed for pain.   lisinopril (ZESTRIL) 40 MG tablet Take 40 mg by mouth daily.   metoprolol  succinate (TOPROL -XL) 25 MG 24 hr tablet TAKE ONE TABLET BY MOUTH EVERY OTHER DAY   rosuvastatin (CRESTOR) 20 MG tablet Take 20 mg by mouth at bedtime.   zolpidem (AMBIEN CR) 12.5 MG CR tablet Take 12.5 mg by mouth at bedtime as needed.   "

## 2024-09-07 LAB — LAB REPORT - SCANNED: EGFR: 67.8

## 2024-09-10 ENCOUNTER — Ambulatory Visit: Attending: Cardiology | Admitting: Cardiology

## 2024-09-10 ENCOUNTER — Encounter: Payer: Self-pay | Admitting: Cardiology

## 2024-09-10 VITALS — BP 120/78 | HR 60 | Ht 74.0 in

## 2024-09-10 DIAGNOSIS — E782 Mixed hyperlipidemia: Secondary | ICD-10-CM

## 2024-09-10 DIAGNOSIS — I480 Paroxysmal atrial fibrillation: Secondary | ICD-10-CM

## 2024-09-10 DIAGNOSIS — I10 Essential (primary) hypertension: Secondary | ICD-10-CM | POA: Diagnosis not present

## 2024-09-10 DIAGNOSIS — Z79899 Other long term (current) drug therapy: Secondary | ICD-10-CM | POA: Diagnosis not present

## 2024-09-10 NOTE — Patient Instructions (Signed)

## 2024-09-17 ENCOUNTER — Other Ambulatory Visit: Payer: Self-pay | Admitting: Cardiology

## 2024-09-25 ENCOUNTER — Other Ambulatory Visit: Payer: Self-pay

## 2024-10-14 ENCOUNTER — Encounter: Payer: Self-pay | Admitting: Cardiology

## 2024-10-17 ENCOUNTER — Telehealth: Payer: Self-pay | Admitting: Cardiology

## 2024-10-17 NOTE — Telephone Encounter (Signed)
 Office is calling to get a copy of patient last office. Fax [phone]. Please advise
